# Patient Record
Sex: Male | Born: 1940 | Race: White | Hispanic: No | Smoking: Former smoker
Health system: Southern US, Community
[De-identification: ages and names within clinical notes are randomized; demographics above are authoritative.]

## PROBLEM LIST (undated history)

## (undated) DIAGNOSIS — R251 Tremor, unspecified: Secondary | ICD-10-CM

## (undated) DIAGNOSIS — Z951 Presence of aortocoronary bypass graft: Secondary | ICD-10-CM

## (undated) DIAGNOSIS — K219 Gastro-esophageal reflux disease without esophagitis: Secondary | ICD-10-CM

## (undated) DIAGNOSIS — I10 Essential (primary) hypertension: Secondary | ICD-10-CM

## (undated) DIAGNOSIS — E119 Type 2 diabetes mellitus without complications: Secondary | ICD-10-CM

## (undated) DIAGNOSIS — I519 Heart disease, unspecified: Secondary | ICD-10-CM

## (undated) DIAGNOSIS — G629 Polyneuropathy, unspecified: Secondary | ICD-10-CM

## (undated) DIAGNOSIS — I2510 Atherosclerotic heart disease of native coronary artery without angina pectoris: Secondary | ICD-10-CM

## (undated) DIAGNOSIS — E669 Obesity, unspecified: Secondary | ICD-10-CM

## (undated) DIAGNOSIS — E662 Morbid (severe) obesity with alveolar hypoventilation: Secondary | ICD-10-CM

## (undated) DIAGNOSIS — G4714 Hypersomnia due to medical condition: Secondary | ICD-10-CM

## (undated) DIAGNOSIS — R011 Cardiac murmur, unspecified: Secondary | ICD-10-CM

## (undated) DIAGNOSIS — M179 Osteoarthritis of knee, unspecified: Secondary | ICD-10-CM

## (undated) DIAGNOSIS — M1710 Unilateral primary osteoarthritis, unspecified knee: Secondary | ICD-10-CM

## (undated) DIAGNOSIS — I219 Acute myocardial infarction, unspecified: Secondary | ICD-10-CM

## (undated) HISTORY — DX: Polyneuropathy, unspecified: G62.9

## (undated) HISTORY — PX: OTHER SURGICAL HISTORY: SHX169

## (undated) HISTORY — DX: Heart disease, unspecified: I51.9

## (undated) HISTORY — DX: Atherosclerotic heart disease of native coronary artery without angina pectoris: I25.10

## (undated) HISTORY — DX: Gastro-esophageal reflux disease without esophagitis: K21.9

## (undated) HISTORY — DX: Unilateral primary osteoarthritis, unspecified knee: M17.10

## (undated) HISTORY — PX: HERNIA REPAIR: SHX51

## (undated) HISTORY — PX: TONSILLECTOMY: SUR1361

## (undated) HISTORY — DX: Hypersomnia due to medical condition: G47.14

## (undated) HISTORY — DX: Obesity, unspecified: E66.9

## (undated) HISTORY — DX: Morbid (severe) obesity with alveolar hypoventilation: E66.2

## (undated) HISTORY — DX: Type 2 diabetes mellitus without complications: E11.9

## (undated) HISTORY — DX: Essential (primary) hypertension: I10

## (undated) HISTORY — DX: Osteoarthritis of knee, unspecified: M17.9

## (undated) HISTORY — DX: Presence of aortocoronary bypass graft: Z95.1

---

## 1990-05-19 DIAGNOSIS — Z951 Presence of aortocoronary bypass graft: Secondary | ICD-10-CM

## 1990-05-19 HISTORY — DX: Presence of aortocoronary bypass graft: Z95.1

## 2007-07-01 ENCOUNTER — Inpatient Hospital Stay (HOSPITAL_COMMUNITY): Admission: AD | Admit: 2007-07-01 | Discharge: 2007-07-03 | Payer: Self-pay | Admitting: Cardiology

## 2010-01-02 ENCOUNTER — Encounter (INDEPENDENT_AMBULATORY_CARE_PROVIDER_SITE_OTHER): Payer: Self-pay | Admitting: *Deleted

## 2010-01-07 ENCOUNTER — Encounter: Payer: Self-pay | Admitting: Internal Medicine

## 2010-06-18 ENCOUNTER — Inpatient Hospital Stay (HOSPITAL_COMMUNITY)
Admission: EM | Admit: 2010-06-18 | Discharge: 2010-06-28 | DRG: 234 | Disposition: A | Discharge status: Skilled Nursing Facility | Payer: Medicare Other | Attending: Cardiovascular Disease | Admitting: Cardiovascular Disease

## 2010-06-18 DIAGNOSIS — E119 Type 2 diabetes mellitus without complications: Secondary | ICD-10-CM | POA: Diagnosis present

## 2010-06-18 DIAGNOSIS — I519 Heart disease, unspecified: Secondary | ICD-10-CM | POA: Diagnosis not present

## 2010-06-18 DIAGNOSIS — Z7982 Long term (current) use of aspirin: Secondary | ICD-10-CM

## 2010-06-18 DIAGNOSIS — I10 Essential (primary) hypertension: Secondary | ICD-10-CM | POA: Diagnosis present

## 2010-06-18 DIAGNOSIS — Z7902 Long term (current) use of antithrombotics/antiplatelets: Secondary | ICD-10-CM

## 2010-06-18 DIAGNOSIS — Z951 Presence of aortocoronary bypass graft: Secondary | ICD-10-CM

## 2010-06-18 DIAGNOSIS — I2510 Atherosclerotic heart disease of native coronary artery without angina pectoris: Principal | ICD-10-CM | POA: Diagnosis present

## 2010-06-18 DIAGNOSIS — I509 Heart failure, unspecified: Secondary | ICD-10-CM | POA: Diagnosis not present

## 2010-06-18 DIAGNOSIS — D62 Acute posthemorrhagic anemia: Secondary | ICD-10-CM | POA: Diagnosis not present

## 2010-06-18 DIAGNOSIS — Z01811 Encounter for preprocedural respiratory examination: Secondary | ICD-10-CM

## 2010-06-18 NOTE — Letter (Signed)
Summary: Previsit letter  [facility]  [address] [place], [place] [address]   Phone: [phone]  Fax: [phone]       01/02/2010 MRN: [id number]  Blake Hurst [address] [place], [place]  [address]  Dear [name],  Welcome to the Gastroenterology Division at [facility].    You are scheduled to see a nurse for your pre-procedure visit on 02-13-2010 at 3:30pm  on the 3rd floor at [facility], [address].  We ask that you try to arrive at our office 15 minutes prior to your appointment time to allow for check-in.  Your nurse visit will consist of discussing your medical and surgical history, your immediate family medical history, and your medications.    Please bring a complete list of all your medications or, if you prefer, bring the medication bottles and we will list them.  We will need to be aware of both prescribed and over the counter drugs.  We will need to know exact dosage information as well.  If you are on blood thinners (Coumadin, Plavix, Aggrenox, Ticlid, etc.) please call our office today/prior to your appointment, as we need to consult with your physician about holding your medication.   Please be prepared to read and sign documents such as consent forms, a financial agreement, and acknowledgement forms.  If necessary, and with your consent, a friend or relative is welcome to sit-in on the nurse visit with you.  Please bring your insurance card so that we may make a copy of it.  If your insurance requires a referral to see a specialist, please bring your referral form from your primary care physician.  No co-pay is required for this nurse visit.     If you cannot keep your appointment, please call [phone] to cancel or reschedule prior to your appointment date.  This allows [place] the opportunity to schedule an appointment for another patient in need of care.    Thank you for choosing [facility] for your medical  needs.  We appreciate the opportunity to care for you.  Please visit [place] at our website  to learn more about our practice.                     Sincerely.                                                                                                                   The Gastroenterology Division

## 2010-06-18 NOTE — Letter (Signed)
Summary: New Patient letter  [facility]  [address] [place], [place] [address]   Phone: [phone]  Fax: [phone]       01/07/2010 MRN: [id number]  Blake Hurst [address] [place], [place]  [address]  Dear Mr. HARDAWAY,  Welcome to the Gastroenterology Division at [facility].    You are scheduled to see Dr.  Marina Goodell on 02-27-10 at 1:45pm on the 3rd floor at [facility], [address].  We ask that you try to arrive at our office 15 minutes prior to your appointment time to allow for check-in.  We would like you to complete the enclosed self-administered evaluation form prior to your visit and bring it with you on the day of your appointment.  We will review it with you.  Also, please bring a complete list of all your medications or, if you prefer, bring the medication bottles and we will list them.  Please bring your insurance card so that we may make a copy of it.  If your insurance requires a referral to see a specialist, please bring your referral form from your primary care physician.  Co-payments are due at the time of your visit and may be paid by cash, check or credit card.     Your office visit will consist of a consult with your physician (includes a physical exam), any laboratory testing he/she may order, scheduling of any necessary diagnostic testing (e.g. x-ray, ultrasound, CT-scan), and scheduling of a procedure (e.g. Endoscopy, Colonoscopy) if required.  Please allow enough time on your schedule to allow for any/all of these possibilities.    If you cannot keep your appointment, please call [phone] to cancel or reschedule prior to your appointment date.  This allows [place] the opportunity to schedule an appointment for another patient in need of care.  If you do not cancel or reschedule by 5 p.m. the business day prior to your appointment date, you will be charged a $50.00 late cancellation/no-show fee.    Thank you for choosing  [facility] for your medical needs.  We appreciate the opportunity to care for you.  Please visit [place] at our website  to learn more about our practice.                     Sincerely,                                                             The Gastroenterology Division

## 2010-06-19 ENCOUNTER — Inpatient Hospital Stay (HOSPITAL_COMMUNITY): Payer: Medicare Other

## 2010-06-19 DIAGNOSIS — I25810 Atherosclerosis of coronary artery bypass graft(s) without angina pectoris: Secondary | ICD-10-CM

## 2010-06-19 LAB — GLUCOSE, CAPILLARY
Glucose-Capillary: 152 mg/dL — ABNORMAL HIGH (ref 70–99)
Glucose-Capillary: 199 mg/dL — ABNORMAL HIGH (ref 70–99)
Glucose-Capillary: 235 mg/dL — ABNORMAL HIGH (ref 70–99)

## 2010-06-19 LAB — DIFFERENTIAL
Basophils Absolute: 0.1 10*3/uL (ref 0.0–0.1)
Basophils Relative: 1 % (ref 0–1)
Eosinophils Absolute: 0.4 10*3/uL (ref 0.0–0.7)
Monocytes Absolute: 0.7 10*3/uL (ref 0.1–1.0)
Monocytes Relative: 11 % (ref 3–12)
Neutro Abs: 3.2 10*3/uL (ref 1.7–7.7)
Neutrophils Relative %: 51 % (ref 43–77)

## 2010-06-19 LAB — HEPATIC FUNCTION PANEL
Albumin: 3.6 g/dL (ref 3.5–5.2)
Alkaline Phosphatase: 61 U/L (ref 39–117)
Indirect Bilirubin: 0.4 mg/dL (ref 0.3–0.9)
Total Bilirubin: 0.6 mg/dL (ref 0.3–1.2)
Total Protein: 5.9 g/dL — ABNORMAL LOW (ref 6.0–8.3)

## 2010-06-19 LAB — BRAIN NATRIURETIC PEPTIDE: Pro B Natriuretic peptide (BNP): 395 pg/mL — ABNORMAL HIGH (ref 0.0–100.0)

## 2010-06-19 LAB — TSH: TSH: 2.862 u[IU]/mL (ref 0.350–4.500)

## 2010-06-19 LAB — CBC
MCH: 30 pg (ref 26.0–34.0)
MCHC: 32.3 g/dL (ref 30.0–36.0)
Platelets: 236 10*3/uL (ref 150–400)

## 2010-06-19 LAB — CARDIAC PANEL(CRET KIN+CKTOT+MB+TROPI)
Relative Index: INVALID (ref 0.0–2.5)
Troponin I: 0.07 ng/mL — ABNORMAL HIGH (ref 0.00–0.06)

## 2010-06-19 LAB — HEMOGLOBIN A1C
Hgb A1c MFr Bld: 7.1 % — ABNORMAL HIGH (ref <5.7)
Mean Plasma Glucose: 157 mg/dL — ABNORMAL HIGH (ref <117)

## 2010-06-19 LAB — MAGNESIUM: Magnesium: 1.6 mg/dL (ref 1.5–2.5)

## 2010-06-20 DIAGNOSIS — Z01810 Encounter for preprocedural cardiovascular examination: Secondary | ICD-10-CM

## 2010-06-20 DIAGNOSIS — I2510 Atherosclerotic heart disease of native coronary artery without angina pectoris: Secondary | ICD-10-CM

## 2010-06-20 LAB — GLUCOSE, CAPILLARY
Glucose-Capillary: 148 mg/dL — ABNORMAL HIGH (ref 70–99)
Glucose-Capillary: 165 mg/dL — ABNORMAL HIGH (ref 70–99)
Glucose-Capillary: 170 mg/dL — ABNORMAL HIGH (ref 70–99)

## 2010-06-20 LAB — BASIC METABOLIC PANEL
Chloride: 99 mEq/L (ref 96–112)
GFR calc Af Amer: >60 mL/min (ref >60)
Potassium: 4.4 mEq/L (ref 3.5–5.1)
Sodium: 139 mEq/L (ref 135–145)

## 2010-06-20 LAB — CBC
HCT: 41.7 % (ref 39.0–52.0)
Hemoglobin: 14.1 g/dL (ref 13.0–17.0)
MCV: 92.7 fL (ref 78.0–100.0)
RBC: 4.5 MIL/uL (ref 4.22–5.81)
WBC: 8.5 10*3/uL (ref 4.0–10.5)

## 2010-06-20 LAB — HEPARIN LEVEL (UNFRACTIONATED)
Heparin Unfractionated: 0.91 IU/mL — ABNORMAL HIGH (ref 0.30–0.70)
Heparin Unfractionated: 0.5 IU/mL (ref 0.30–0.70)

## 2010-06-21 ENCOUNTER — Inpatient Hospital Stay (HOSPITAL_COMMUNITY): Payer: Medicare Other

## 2010-06-21 LAB — GLUCOSE, CAPILLARY
Glucose-Capillary: 124 mg/dL — ABNORMAL HIGH (ref 70–99)
Glucose-Capillary: 138 mg/dL — ABNORMAL HIGH (ref 70–99)
Glucose-Capillary: 172 mg/dL — ABNORMAL HIGH (ref 70–99)
Glucose-Capillary: 176 mg/dL — ABNORMAL HIGH (ref 70–99)
Glucose-Capillary: 185 mg/dL — ABNORMAL HIGH (ref 70–99)

## 2010-06-21 LAB — BLOOD GAS, ARTERIAL
Acid-Base Excess: 2.6 mmol/L — ABNORMAL HIGH (ref 0.0–2.0)
Bicarbonate: 26.6 meq/L — ABNORMAL HIGH (ref 20.0–24.0)
Drawn by: 234041
FIO2: 21 %
O2 Saturation: 92.5 %
Patient temperature: 98.6
TCO2: 27.9 mmol/L (ref 0–100)
pCO2 arterial: 40.8 mmHg (ref 35.0–45.0)
pH, Arterial: 7.43 (ref 7.350–7.450)
pO2, Arterial: 63.6 mmHg — ABNORMAL LOW (ref 80.0–100.0)

## 2010-06-21 LAB — CBC
HCT: 41.6 % (ref 39.0–52.0)
Hemoglobin: 14.1 g/dL (ref 13.0–17.0)
MCH: 31.2 pg (ref 26.0–34.0)
MCHC: 33.9 g/dL (ref 30.0–36.0)
MCV: 92 fL (ref 78.0–100.0)
Platelets: 225 K/uL (ref 150–400)
RBC: 4.52 MIL/uL (ref 4.22–5.81)
RDW: 12.8 % (ref 11.5–15.5)
WBC: 7.9 K/uL (ref 4.0–10.5)

## 2010-06-21 LAB — HEPARIN LEVEL (UNFRACTIONATED)

## 2010-06-22 LAB — BASIC METABOLIC PANEL
BUN: 14 mg/dL (ref 6–23)
CO2: 25 mEq/L (ref 19–32)
Chloride: 101 mEq/L (ref 96–112)
Glucose, Bld: 172 mg/dL — ABNORMAL HIGH (ref 70–99)
Potassium: 4 mEq/L (ref 3.5–5.1)
Sodium: 138 mEq/L (ref 135–145)

## 2010-06-22 LAB — CBC
HCT: 40.8 % (ref 39.0–52.0)
MCH: 31.3 pg (ref 26.0–34.0)
MCV: 90.5 fL (ref 78.0–100.0)
Platelets: 251 10*3/uL (ref 150–400)
RBC: 4.51 MIL/uL (ref 4.22–5.81)
WBC: 7.4 10*3/uL (ref 4.0–10.5)

## 2010-06-22 LAB — GLUCOSE, CAPILLARY
Glucose-Capillary: 149 mg/dL — ABNORMAL HIGH (ref 70–99)
Glucose-Capillary: 155 mg/dL — ABNORMAL HIGH (ref 70–99)
Glucose-Capillary: 136 mg/dL — ABNORMAL HIGH (ref 70–99)

## 2010-06-23 LAB — COMPREHENSIVE METABOLIC PANEL
Albumin: 3.9 g/dL (ref 3.5–5.2)
BUN: 15 mg/dL (ref 6–23)
Calcium: 9.8 mg/dL (ref 8.4–10.5)
Chloride: 101 mEq/L (ref 96–112)
Creatinine, Ser: 1.12 mg/dL (ref 0.4–1.5)
GFR calc non Af Amer: >60 mL/min (ref >60)
Total Bilirubin: 0.9 mg/dL (ref 0.3–1.2)

## 2010-06-23 LAB — CBC
MCH: 31 pg (ref 26.0–34.0)
Platelets: 250 10*3/uL (ref 150–400)
RBC: 4.64 MIL/uL (ref 4.22–5.81)
WBC: 8.6 10*3/uL (ref 4.0–10.5)

## 2010-06-23 LAB — APTT: aPTT: 68 seconds — ABNORMAL HIGH (ref 24–37)

## 2010-06-23 LAB — GLUCOSE, CAPILLARY
Glucose-Capillary: 125 mg/dL — ABNORMAL HIGH (ref 70–99)
Glucose-Capillary: 138 mg/dL — ABNORMAL HIGH (ref 70–99)

## 2010-06-23 LAB — PROTIME-INR
INR: 1.03 (ref 0.00–1.49)
Prothrombin Time: 13.7 seconds (ref 11.6–15.2)

## 2010-06-23 LAB — HEPARIN LEVEL (UNFRACTIONATED): Heparin Unfractionated: 0.29 IU/mL — ABNORMAL LOW (ref 0.30–0.70)

## 2010-06-24 ENCOUNTER — Inpatient Hospital Stay (HOSPITAL_COMMUNITY): Payer: Medicare Other

## 2010-06-24 DIAGNOSIS — I25810 Atherosclerosis of coronary artery bypass graft(s) without angina pectoris: Secondary | ICD-10-CM

## 2010-06-24 LAB — POCT I-STAT 4, (NA,K, GLUC, HGB,HCT)
Glucose, Bld: 146 mg/dL — ABNORMAL HIGH (ref 70–99)
Glucose, Bld: 131 mg/dL — ABNORMAL HIGH (ref 70–99)
Glucose, Bld: 114 mg/dL — ABNORMAL HIGH (ref 70–99)
HCT: 37 % — ABNORMAL LOW (ref 39.0–52.0)
HCT: 37 % — ABNORMAL LOW (ref 39.0–52.0)
HCT: 32 % — ABNORMAL LOW (ref 39.0–52.0)
HCT: 27 % — ABNORMAL LOW (ref 39.0–52.0)
HCT: 33 % — ABNORMAL LOW (ref 39.0–52.0)
HCT: 37 % — ABNORMAL LOW (ref 39.0–52.0)
Hemoglobin: 12.6 g/dL — ABNORMAL LOW (ref 13.0–17.0)
Hemoglobin: 11.2 g/dL — ABNORMAL LOW (ref 13.0–17.0)
Hemoglobin: 9.2 g/dL — ABNORMAL LOW (ref 13.0–17.0)
Potassium: 4.9 mEq/L (ref 3.5–5.1)
Potassium: 3.9 mEq/L (ref 3.5–5.1)
Sodium: 138 mEq/L (ref 135–145)
Sodium: 137 mEq/L (ref 135–145)
Sodium: 133 mEq/L — ABNORMAL LOW (ref 135–145)
Sodium: 137 mEq/L (ref 135–145)
Sodium: 139 mEq/L (ref 135–145)

## 2010-06-24 LAB — PROTIME-INR: INR: 1.49 (ref 0.00–1.49)

## 2010-06-24 LAB — BASIC METABOLIC PANEL
CO2: 26 mEq/L (ref 19–32)
Chloride: 105 mEq/L (ref 96–112)
GFR calc Af Amer: >60 mL/min (ref >60)
Potassium: 4.4 mEq/L (ref 3.5–5.1)
Sodium: 138 mEq/L (ref 135–145)

## 2010-06-24 LAB — PLATELET COUNT: Platelets: 149 10*3/uL — ABNORMAL LOW (ref 150–400)

## 2010-06-24 LAB — CBC
Hemoglobin: 14.2 g/dL (ref 13.0–17.0)
Hemoglobin: 12.7 g/dL — ABNORMAL LOW (ref 13.0–17.0)
MCH: 31.5 pg (ref 26.0–34.0)
MCH: 31.8 pg (ref 26.0–34.0)
MCHC: 34.4 g/dL (ref 30.0–36.0)
MCHC: 34.4 g/dL (ref 30.0–36.0)
MCV: 91.6 fL (ref 78.0–100.0)
MCV: 92.5 fL (ref 78.0–100.0)
RBC: 4.52 MIL/uL (ref 4.22–5.81)
RBC: 4.06 MIL/uL — ABNORMAL LOW (ref 4.22–5.81)
RDW: 12.7 % (ref 11.5–15.5)

## 2010-06-24 LAB — POCT I-STAT 3, ART BLOOD GAS (G3+)
Acid-base deficit: 3 mmol/L — ABNORMAL HIGH (ref 0.0–2.0)
Bicarbonate: 23 mEq/L (ref 20.0–24.0)
O2 Saturation: 100 %
O2 Saturation: 95 %
Patient temperature: 38.5
TCO2: 20 mmol/L (ref 0–100)
pCO2 arterial: 46 mmHg — ABNORMAL HIGH (ref 35.0–45.0)
pCO2 arterial: 41.8 mmHg (ref 35.0–45.0)
pCO2 arterial: 37.5 mmHg (ref 35.0–45.0)
pO2, Arterial: 256 mmHg — ABNORMAL HIGH (ref 80.0–100.0)
pO2, Arterial: 63 mmHg — ABNORMAL LOW (ref 80.0–100.0)

## 2010-06-24 LAB — APTT: aPTT: 27 seconds (ref 24–37)

## 2010-06-24 LAB — POCT I-STAT, CHEM 8
Creatinine, Ser: 1.1 mg/dL (ref 0.4–1.5)
Glucose, Bld: 141 mg/dL — ABNORMAL HIGH (ref 70–99)
Hemoglobin: 13.3 g/dL (ref 13.0–17.0)
Potassium: 5.1 mEq/L (ref 3.5–5.1)

## 2010-06-24 LAB — GLUCOSE, CAPILLARY
Glucose-Capillary: 164 mg/dL — ABNORMAL HIGH (ref 70–99)
Glucose-Capillary: 103 mg/dL — ABNORMAL HIGH (ref 70–99)

## 2010-06-24 LAB — CREATININE, SERUM: GFR calc Af Amer: >60 mL/min (ref >60)

## 2010-06-24 LAB — MRSA PCR SCREENING: MRSA by PCR: NEGATIVE

## 2010-06-25 ENCOUNTER — Inpatient Hospital Stay (HOSPITAL_COMMUNITY): Payer: Medicare Other

## 2010-06-25 LAB — CBC
HCT: 32.7 % — ABNORMAL LOW (ref 39.0–52.0)
Hemoglobin: 11.2 g/dL — ABNORMAL LOW (ref 13.0–17.0)
Hemoglobin: 11.3 g/dL — ABNORMAL LOW (ref 13.0–17.0)
MCHC: 33.3 g/dL (ref 30.0–36.0)
RDW: 13.1 % (ref 11.5–15.5)
RDW: 13.3 % (ref 11.5–15.5)
WBC: 11.8 10*3/uL — ABNORMAL HIGH (ref 4.0–10.5)

## 2010-06-25 LAB — POCT I-STAT 3, ART BLOOD GAS (G3+)
Bicarbonate: 18.6 mEq/L — ABNORMAL LOW (ref 20.0–24.0)
Bicarbonate: 18.8 mEq/L — ABNORMAL LOW (ref 20.0–24.0)
Bicarbonate: 20.7 mEq/L (ref 20.0–24.0)
Patient temperature: 38.1
TCO2: 20 mmol/L (ref 0–100)
TCO2: 20 mmol/L (ref 0–100)
TCO2: 22 mmol/L (ref 0–100)
pCO2 arterial: 37.3 mmHg (ref 35.0–45.0)
pCO2 arterial: 39.5 mmHg (ref 35.0–45.0)
pCO2 arterial: 39.8 mmHg (ref 35.0–45.0)
pH, Arterial: 7.291 — ABNORMAL LOW (ref 7.350–7.450)
pH, Arterial: 7.312 — ABNORMAL LOW (ref 7.350–7.450)
pH, Arterial: 7.328 — ABNORMAL LOW (ref 7.350–7.450)
pO2, Arterial: 73 mmHg — ABNORMAL LOW (ref 80.0–100.0)
pO2, Arterial: 77 mmHg — ABNORMAL LOW (ref 80.0–100.0)
pO2, Arterial: 82 mmHg (ref 80.0–100.0)

## 2010-06-25 LAB — BASIC METABOLIC PANEL
CO2: 23 mEq/L (ref 19–32)
Calcium: 8.1 mg/dL — ABNORMAL LOW (ref 8.4–10.5)
GFR calc Af Amer: >60 mL/min (ref >60)
GFR calc non Af Amer: >60 mL/min (ref >60)
Glucose, Bld: 169 mg/dL — ABNORMAL HIGH (ref 70–99)
Potassium: 4.1 mEq/L (ref 3.5–5.1)
Sodium: 140 mEq/L (ref 135–145)

## 2010-06-25 LAB — GLUCOSE, CAPILLARY
Glucose-Capillary: 77 mg/dL (ref 70–99)
Glucose-Capillary: 100 mg/dL — ABNORMAL HIGH (ref 70–99)
Glucose-Capillary: 132 mg/dL — ABNORMAL HIGH (ref 70–99)
Glucose-Capillary: 159 mg/dL — ABNORMAL HIGH (ref 70–99)
Glucose-Capillary: 196 mg/dL — ABNORMAL HIGH (ref 70–99)
Glucose-Capillary: 213 mg/dL — ABNORMAL HIGH (ref 70–99)

## 2010-06-25 LAB — CREATININE, SERUM
Creatinine, Ser: 1.32 mg/dL (ref 0.4–1.5)
GFR calc Af Amer: >60 mL/min (ref >60)

## 2010-06-26 ENCOUNTER — Inpatient Hospital Stay (HOSPITAL_COMMUNITY): Payer: Medicare Other

## 2010-06-26 LAB — CBC
HCT: 32.9 % — ABNORMAL LOW (ref 39.0–52.0)
Hemoglobin: 10.9 g/dL — ABNORMAL LOW (ref 13.0–17.0)
MCH: 31.1 pg (ref 26.0–34.0)
MCHC: 33.1 g/dL (ref 30.0–36.0)
MCV: 94 fL (ref 78.0–100.0)

## 2010-06-26 LAB — CROSSMATCH
ABO/RH(D): O POS
Antibody Screen: NEGATIVE
Unit division: 0

## 2010-06-26 LAB — BASIC METABOLIC PANEL
CO2: 27 mEq/L (ref 19–32)
Calcium: 8.6 mg/dL (ref 8.4–10.5)
Creatinine, Ser: 1.08 mg/dL (ref 0.4–1.5)
Glucose, Bld: 156 mg/dL — ABNORMAL HIGH (ref 70–99)

## 2010-06-26 LAB — GLUCOSE, CAPILLARY: Glucose-Capillary: 140 mg/dL — ABNORMAL HIGH (ref 70–99)

## 2010-06-27 LAB — CBC
MCH: 31.7 pg (ref 26.0–34.0)
Platelets: 184 10*3/uL (ref 150–400)
RBC: 3.56 MIL/uL — ABNORMAL LOW (ref 4.22–5.81)
RDW: 13.3 % (ref 11.5–15.5)
WBC: 12.3 10*3/uL — ABNORMAL HIGH (ref 4.0–10.5)

## 2010-06-27 LAB — GLUCOSE, CAPILLARY
Glucose-Capillary: 144 mg/dL — ABNORMAL HIGH (ref 70–99)
Glucose-Capillary: 134 mg/dL — ABNORMAL HIGH (ref 70–99)
Glucose-Capillary: 139 mg/dL — ABNORMAL HIGH (ref 70–99)

## 2010-06-27 LAB — BASIC METABOLIC PANEL
Chloride: 101 mEq/L (ref 96–112)
Creatinine, Ser: 0.98 mg/dL (ref 0.4–1.5)
GFR calc Af Amer: >60 mL/min (ref >60)
GFR calc non Af Amer: >60 mL/min (ref >60)
Potassium: 4.3 mEq/L (ref 3.5–5.1)

## 2010-06-27 NOTE — Procedures (Signed)
NAME:  Blake Hurst, BATALLA NO.:  [id number]  MEDICAL RECORD NO.:  [id number]           PATIENT TYPE:  I  LOCATION:  2001                         FACILITY:  [facility]  PHYSICIAN:  Nicki Guadalajara, M.D.     DATE OF BIRTH:  11/11/1940  DATE OF PROCEDURE: DATE OF DISCHARGE:                           CARDIAC CATHETERIZATION   INDICATIONS:  Blake Hurst is a 70 year old gentleman who has a history of hypertension, type 2 diabetes mellitus, hyperlipidemia, known coronary artery disease.  He underwent CABG revascularization surgery approximately 15 years ago in [place].  At that time, apparently he had a graft to his circumflex system, and a vein graft to his RCA.  He apparently underwent cardiac catheterization in 2009 by Dr. Jacinto Halim and had stenting at several sites and the sequential graft supplying the circumflex marginal vessels.  At that time, he had an occluded graft which had supplied the RCA and he had good collateralization from the distal circumflex via the graft supplying the distal RCA.  The patient had been doing fairly well.  Apparently, he was admitted to [facility] with chest pains suggestive of possible unstable angina yesterday.  He is now referred for diagnostic cardiac catheterization.  PROCEDURE:  After premedication with Versed 1 mg plus fentanyl 25 mcg, the patient prepped and draped in usual fashion.  His right femoral artery was punctured anteriorly and a 5-French sheath was inserted without difficulty.  Diagnostic catheterization was done utilizing 5- [place] Judkins for left and right coronary catheters.  The right catheter was used for selective angiography into the vein grafts as well as into the unbypassed left internal mammary artery system.  A 5-French pigtail catheter was used for biplane cine left ventriculography as well as distal aortography.  Hemostasis was obtained by direct manual pressure.  The patient  tolerated the procedure well.  HEMODYNAMIC DATA:  Central aortic pressure was 100/53.  Left ventricular pressure 100/15.  ANGIOGRAPHIC DATA:  Left main coronary artery was a short normal vessel, which bifurcated into the LAD and left circumflex system.  The LAD was a moderate-sized vessel that gave rise to a large first diagonal vessel.  There was 80-90% proximal stenosis in the first diagonal vessel as well as an 80% bifurcation stenosis in its midportion.  The LAD in its midsegment had new progressive 80% stenosis and then had another area of diffuse 60% narrowing.  The LAD extended to and wrapped around the apex.  There was significant collateralization to obtuse marginal branch of the circumflex coronary artery via the apical LAD collaterals.  The circumflex vessel had 95-99% ostial stenosis and that was totally occluded after an occluded marginal vessel.  The native right coronary artery was diffusely diseased and had 90% proximal stenosis followed by 80% stenosis and had 95% stenosis in the marginal branch with diffuse 70-80% stenosis.  There was faint filling of the distal LAD with competitive filling possibly due to collaterals originating from the left system.  The vein graft which had supplied the right coronary artery was totally occluded at its origin.  Next, the vein graft which was a sequential graft  supplying several marginal vessels had 50% eccentric narrowing proximally.  The previously placed stent in the distal portion of the proximal limb of the graft was widely patent.  The graft anastomosed into the OM vessel.  The sequential limb was off stented from the prior intervention and this was now subtotally concluded with 99% stenosis. There was very faint filling with TIMI one-half flow in the distal marginal vessel beyond the subtotal occlusion.  The unbypassed left internal mammary artery was patent.  Biplane cine left ventriculography revealed mild LV  dysfunction, ejection fraction was approximately 45%.  There was moderate area of mid to basal posterior hypocontractility on the RAO projection and inferoapical hypocontractility on the LAO projection.  Distal aortography revealed patent renal arteries without significant aortoiliac disease.  IMPRESSION: 1. Mild-to-moderate left ventricular dysfunction with an ejection     fraction of approximately 45% with moderate area of     hypocontractility in the mid to basal posterior wall and     inferoapical segment. 2. Significant three-vessel coronary artery disease with 80-90%     proximal diagonal stenosis followed by 80% mid first diagonal     stenosis of the left anterior descending, 80% stenosis of the mid     left anterior descending followed by 60% stenosis of the left     anterior descending with significant collateralization to an obtuse     marginal branch of the circumflex and faint collateralization to     the distal right coronary artery. 3. Total occlusion of the proximal circumflex coronary artery. 4. High-grade segmental native right coronary artery disease with 90%     proximal stenosis, 80% proximal to mid stenosis, 95% stenosis, and     a moderate-sized marginal branch, diffuse 70-80% stenosis proximal     to the crux and faint filling of the distal right coronary artery. 5. Total occlusion of the vein graft, which had supplied the right     coronary artery (old). 6. Progressive disease in the vein graft which had sequentially     supplied several circumflex marginal vessels, now with 50%     narrowing eccentrically in the proximal portion of the graft, a     patent previously placed stent in the distal limb of the proximal     segment of the graft, and subtotal occlusion and diffusely stented     sequential limb of the graft which had supplied the distal     circumflex marginal vessel with very faint filling of this distal     marginal vessel. 7. No evidence for  renal artery stenosis or significant aortoiliac     disease.  RECOMMENDATIONS:  Blake Hurst is 15 years status post CABG revascularization surgery to the circumflex and right coronary artery. He now has developed progressive disease in his LAD, diagonal system, and now has subtotal occlusion of the graft supplying sequentially the circumflex vessel as well as old occlusion of the graft supplying the right coronary artery.  Collaterals are in jeopardy.  Surgical consultation will be obtained for consideration of possible redo CABG revascularization surgery.          ______________________________ Nicki Guadalajara, M.D.     TK/MEDQ  D:  06/19/2010  T:  06/20/2010  Job:  [id number]  cc:   Ritta Slot, MD  Electronically Signed by Nicki Guadalajara M.D. on 06/27/2010 04:17:31 PM

## 2010-06-27 NOTE — Op Note (Signed)
NAME:  Blake Hurst, Blake Hurst NO.:  [id number]  MEDICAL RECORD NO.:  [id number]           PATIENT TYPE:  I  LOCATION:  2312                         FACILITY:  [facility]  PHYSICIAN:  Salvatore Decent. 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  PROCEDURE:  Redo median sternotomy, extracorporeal circulation, redo coronary artery bypass grafting x3 (left internal mammary artery to LAD, left radial artery to obtuse marginal 2, saphenous vein graft to first diagonal).  SURGEON:  Salvatore Decent. [name], MD  ASSISTANT:  Evelene Croon, MD  SECOND ASSISTANT:  Doree Fudge, PA  THIRD ASSISTANT:  Sol Blazing, PA  ANESTHESIA:  General.  FINDINGS:  Severe intrapericardial adhesions, cardiomegaly, good-quality targets that were grafted, OM-4 and posterior descending both small vessels, 1 mm in diameter without appropriate conduit for grafting, good- quality conduits.  CLINICAL NOTE:  Blake Hurst is a 70 year old gentleman with a history of coronary artery disease.  He had coronary artery bypass grafting in the mid 90s in [place].  He now presents with unstable coronary syndrome.  He had had multiple previous angioplasties and stentings of his vein grafts.  Catheterization on this admission, the patient was found to have severe native vein graft of three-vessel coronary artery disease not amenable to percutaneous intervention.  Ejection fraction was approximately 40%.  The patient was advised to undergo a redo coronary artery bypass grafting.  Indications, risks, benefits, and alternatives  were discussed in detail with the patient.  He understood and accepted the risks and agreed to proceed.  We did discuss possible utilization of left radial artery in addition to the left mammary artery.  The left radial was normal by the vascular Doppler of the palmar arch.  The Allen test was normal in the preoperative holding area and was confirmed with pulse oximetry plethysmography.  Blake Hurst understood and accepted the risks of surgery and agreed to proceed.  OPERATIVE NOTE:  Blake Hurst was brought to the preoperative holding area on June 24, 2010.  There, the Anesthesia Service under the direction of Dr. Jairo Ben placed a Swan-Ganz catheter and an arterial blood pressure monitoring line.  Intravenous antibiotics were administered.  The patient was taken to the operating room, anesthetized, and intubated.  A Foley catheter was placed.  The chest, abdomen, left arm, and legs were prepped and draped in the usual sterile fashion.  An incision was made over the volar aspect of the left wrist overlying the radial pulse.  A short incision was made initially.  Skin and subcutaneous tissue were dissected.  The radial artery was identified and the overlying fascia incised.  A short segment of the radial artery was mobilized.  There was a good pulse distally with proximal compression.  The incision then was extended to just below the antecubital fossa and the vessel was harvested using the harmonic  scalpel.  Simultaneously to this, an incision was made in the medial aspect of the right leg at the level of the knee and the greater saphenous vein was harvested from the right thigh and upper calf endoscopically.  Majority of the thigh vein was unusable, but there was a portion sufficient for grafting the diagonal that was of satisfactory quality.  Simultaneously, again with this, a median sternotomy was performed.  The wires were removed.  The sternum was divided using an  oscillating saw. Shaft of the hemi-sternum was elevated and the underlying tissue was dissected off entering the pleural space on both sides.  A rule track retractor was placed for exposure of the left mammary artery which was harvested in standard fashion.  This was extremely difficult due to the patient's body habitus and scarring from the previous procedure.  The mammary was a good-quality vessel.  The patient was fully heparinized prior to dividing the distal end of the mammary artery.  By this time, radial incision was closed and the vein had been harvested as well and the leg incision was closed.  An Ankeney retractor was placed.  Dissection was carried out.  The pericardium had not been closed.  The pericardial edge was identified on the right side and the right atrium was dissected out.  There was an old occluded vein graft to the posterior descending.  Care was taken not to touch or otherwise disturb the vein graft.  It was left adherent to the heart.  After dissecting out the right atrium, the dissection was carried up to the aorta which was exposed taking care not to manipulate the preexisting vein grafts.  After confirming adequate anticoagulation with ACT measurement, the aorta was cannulated via concentric 2-0 Ethibond pledgeted pursestring sutures.  A dual-stage venous cannula was placed via pursestring suture in the rectal appendage.  Cardiopulmonary bypass was instituted and the patient was cooled to 32 degrees Celsius. The remainder of the dissection was carried out with the heart empty, freeing up the remaining intrapericardial adhesions again using a no- touch technique on the vein graft to obtuse marginals 2 and 4.  The coronary arteries were inspected and anastomotic sites were chosen.  The conduits were inspected and cut to length.  A foam pad was placed in the pericardium to insulate the heart and protect the left phrenic nerve.  A temperature probe was placed in  the myocardial septum and a cardioplegic cannula was placed in the ascending aorta.  The aorta was crossclamped.  The left ventricle was emptied via the aortic root vent.  Cardiac arrest then was achieved with a combination of cold antegrade and retrograde blood cardioplegia and topical iced saline.  Initial 500 mL of cardioplegia was administered antegrade. There was rapid diastolic arrest.  An additional 500 mL was administered retrograde and there was myocardial septal cooling to 8 degrees Celsius. The following distal anastomoses were performed.  First, a reverse saphenous vein graft was placed end-to-side to the first diagonal branch of the LAD.  This was a 1.5-mm vessel at the site of the anastomosis.  There was plaquing proximally, it was a good vessel distally.  The vein graft was anastomosed end-to-side with a running 7-0 Prolene suture.  Vein was relatively thick walled, was normal caliber and was satisfactory, although only fair in quality.  At the completion of the anastomosis, it was probed proximally and distally with a one and a half probe.  All anastomoses were probed proximally and distally at their completion.  Cardioplegia was administered down the vein graft. There was good flow and good hemostasis.Next, the distal end of the left radial artery was beveled and was anastomosed end-to-side to obtuse marginal 2.  This was the dominant lateral branch.  It accepted a 1.5-mm probe both proximally and distally.  It was a good-quality vessel at the site of the anastomosis which was just beyond the previous side-to-side anastomosis.  This was performed with a running 8-0 Prolene suture.  Again, a probe passed easily proximally and distally.  The graft flushed easily.  Cardioplegia was administered.  There was good flow and good hemostasis.  Next, the left internal mammary artery was beveled and pericardium was incised.  The LAD was exposed and an arteriotomy was made.  This  was a 1.5-mm good-quality target vessel.  The left mammary was a 2-mm good- quality conduit, it was anastomosed end-to-side with a running 8-0 Prolene suture.  At the completion of the anastomosis, the bulldog clamp was briefly removed to inspect for hemostasis.  Immediate rapid septal rewarming was noted.  The bulldog clamp was replaced and the mammary pedicle was tacked at the epicardial surface of the heart with 6-0 Prolene sutures.  Additional cardioplegia was administered down the vein graft and aortic root as well as retrograde.  The vein graft and radial artery were cut to length.  The proximal vein graft anastomosis was performed to a 4.0-mm punch aortotomy with a running 6-0 Prolene suture. The radial was placed to the hood of the previous obtuse marginal vein graft which was normal.  A 4.0-mm punch was used to create an opening and the end-to-side anastomosis was performed with a running 7-0 Prolene suture.  At the completion of the final proximal anastomosis, the patient was placed in Trendelenburg position, lidocaine was administered, a warm dose of retrograde cardioplegia was administered to complete de-airing.  The aortic crossclamp was then removed.  The bulldog clamp was removed from the left mammary artery.  The total crossclamp time was 72 minutes.  The patient required defibrillation, but failed to respond to 10 J, but did convert to sinus rhythm after shock with 20 J.  It was then in sinus rhythm thereafter.  While rewarming was completed, all proximal and distal anastomoses were inspected for hemostasis.  Epicardial pacing wires were placed on the right ventricle and right atrium.  When the patient had rewarmed to a core temperature of 37 degrees Celsius, he was weaned from cardiopulmonary bypass on the first attempt.  The total bypass time was 132 minutes.  Plan initially had been to start a dopamine infusion, but the patient did not require that and weaned from  bypass without inotropic support.  Initial cardiac index was greater than 2 L per minute per meter squared and he remained hemodynamically stable throughout the postbypass period.  Test dose protamine was administered and was well tolerated.  The atrial and aortic cannulae were removed.  The remainder of the protamine was administered without incident.  Chest was irrigated with 1 L of warm saline.  Hemostasis was achieved.  There was no attempt to close the pericardium.  A left pleural, right pleural, and single mediastinal chest tubes were placed through separate subcostal incisions.  The sternum was closed with interrupted heavy-gauge double stainless steel wires.  The pectoralis fascia, subcutaneous tissue, and skin were closed in standard fashion.  All sponge, needle, and instrument counts were correct at the end of the procedure.  There were no intraoperative complications, and the patient  was taken from the operating room to the surgical intensive care unit in fair condition.     Salvatore Decent [name], M.D.     SCH/MEDQ  D:  06/24/2010  T:  06/25/2010  Job:  [id number]  cc:   Ritta Slot, MD Gaspar Garbe, M.D.  Electronically Signed by Charlett Lango M.D. on 06/27/2010 10:24:13 AM

## 2010-06-27 NOTE — Consult Note (Signed)
NAME:  EISEN, ROBENSON NO.:  [id number]  MEDICAL RECORD NO.:  [id number]           PATIENT TYPE:  I  LOCATION:  2001                         FACILITY:  [facility]  PHYSICIAN:  Salvatore Decent. [name], M.D.DATE OF BIRTH:  1941/04/20  DATE OF CONSULTATION:  06/19/2010 DATE OF DISCHARGE:                                CONSULTATION   REASON FOR CONSULTATION:  Question of redo bypass grafting.  HISTORY OF PRESENT ILLNESS:  Mr. Duffey is a 70 year old gentleman with a history of coronary artery disease who previously had coronary artery bypass grafting 15 years ago in the area of [place], at that time, a saphenous vein graft apparently was placed to the right coronary artery, then a sequential saphenous vein graft was done to obtuse marginal 1 and 4.  In 2005, the patient had PTCA and stenting of the saphenous vein graft to the obtuse marginals, the saphenous vein to the right coronary artery was occluded.  He underwent additional PTCA and stenting of that graft in 2009.  He says that over the past 3 months, he has been having a tightness in his chest.  He describes this as a sensation similar to breathing extremely cold air, nut was not related to that per se.  He gets it with exertion, sometimes even minimal exertion and is localized over the left breast.  He yesterday had an episode of chest pain, which he described as a severe pressure, which came across his chest and the upper portion of both arms fell extremely heavy.  He came to the emergency room and was treated and has been pain- free since admission.  He did have diaphoresis and shortness of breath associated with the pain.  He did not have any syncope or presyncope. He ruled out for MI by cardiac enzymes.  His EKG showed a right bundle- branch block, previous inferior infarct.  There was T-wave abnormality suggesting lateral ischemia.  Today, he underwent cardiac catheterization by Dr. Nicki Guadalajara, where  he was found to have severe native three-vessel coronary artery disease.  His right coronary artery is essentially subtotaled and receives collaterals.  His LAD had tandem 80 and 60% stenoses, diagonal branch had 80% stenosis.  There were severe disease in the vein graft to obtuse marginals 1 and 4, and essentially subtotaled after obtuse marginal 1.  Ejection fraction was 40% with inferior-basal and anterior-apical hypokinesis.  The patient currently is pain-free.  PAST MEDICAL HISTORY:  Significant for: 1. Coronary artery disease, previous coronary artery bypass grafting     x3 using saphenous vein from left leg, previous PTCA and stenting     in 2005 and 2009. 2. Congestive heart failure, diagnosed this admission. 3. Adult-onset type 2 diabetes mellitus, non-insulin dependent. 4. Dyslipidemia. 5. Varicose veins with recent varicose vein procedure approximately 4     months ago. 6. Hypertension. 7. Venous insufficiency. 8. Umbilical hernia repair. 9. Tonsillectomy.  MEDICATIONS ON ADMISSION: 1. Enteric-coated aspirin 81 mg daily. 2. Plavix 75 mg daily. 3. Lisinopril/hydrochlorothiazide 10/12.5 one tablet daily. 4. Omeprazole 20 mg b.i.d. 5. Lipitor 80 mg daily. 6. Lopressor 50 mg b.i.d. 7. Metformin 1000 mg  b.i.d. 8. Januvia 100 mg daily. 9. Lyrica 75 mg daily.  He is allergic to NIACIN, which causes flushing.  FAMILY HISTORY:  Significant for coronary artery disease.  SOCIAL HISTORY:  The patient has remote history of smoking, but quit about 35 years ago.  He occasionally drinks alcohol.  REVIEW OF SYSTEMS:  The patient has been feeling well other than symptoms noted in the history and physical.  All other systems are negative.  PHYSICAL EXAMINATION:  Mr. Cravens is an obese 70 year old white male in no acute distress. VITAL SIGNS:  Afebrile.  His blood pressure is 121/66, pulse 58, respirations are 16, oxygen saturation is 94% on room air. GENERAL:  He is well  developed and well nourished, although overweight. HEENT:  Unremarkable.  He is wearing glasses. NEUROLOGIC:  Alert and oriented x3 with no focal deficits. NECK:  Supple without thyromegaly, adenopathy, or bruits. CARDIAC:  Has regular rate and rhythm.  Normal S1-S2.  No murmur, rubs or gallops.  He is get a well-healed surgical scar in his mid sternum, 2+ radial artery pulses bilaterally with equivocal Allen test on both sides.  He is left handed. ABDOMEN:  Obese, soft, and nontender. LUNGS:  Clear with equal breath sounds bilaterally. EXTREMITIES:  Lower extremities have venous stasis changes bilaterally below the knees.  There is a well-healed surgical scar from left ankle to mid-thigh on the left.  LABORATORY DATA:  Hemoglobin A1c 7.1.  TSH 2.862.  CPK 69, MB 1.7, troponin 0.07.  BNP 395.  White count 6.3, hematocrit 40, platelets are 236.  Sodium 137, potassium 4.0, BUN 14, creatinine 1.24, nonfasting glucose was 222.  His PT is 13.1 with an INR of 0.97.  LFTs within normal limits.  IMPRESSION:  Mr. Hendriks is a 70 year old gentleman with longstanding coronary artery disease, previous coronary artery bypass grafting back in the mid 90s in the area of [place].  He has had some additional percutaneous transluminal coronary angioplasty and stenting of vein grafts since that time.  He now presents with progression of native disease as well as progression of vein graft disease.  Redo coronary artery bypass grafting is indicated for survival benefit and relief of symptoms, death, adequate conduit can be found to enable complete revascularization.  I discussed in detail with the patient the nature and extends the surgery.  He understands that redo surgery often takes longer, it is more difficult and has less certain short and long-term results.  He understands that the risks of surgery include but are not limited to death, stroke, MI, DVT, PE, bleeding, possible need for  transfusions, infections as well as other organ system dysfunction including respiratory, renal, or GI complications.  He has been on Plavix and that needs to be off for at least 5 days prior to redo bypass grafting.  Earliest possible operative time would be Monday, June 24, 2010.  In the meantime, we need to establish of adequate conduit with the vascular lab assessment of the palmar arches particularly in the right hand given he is left-handed.  We will also need records from his previous surgery in the area of [place], which he says that Dr. Lynnea Ferrier at [facility] has, and also need the records from his vein procedure that was done at [facility] within the past couple of months.  We will also order a vein mapping of the right leg as well as the left thigh and see what conduit may be available from that standpoint before making a final decision as to  redo grafting.     Salvatore Decent [name], M.D.     SCH/MEDQ  D:  06/19/2010  T:  06/20/2010  Job:  [id number]  cc:   Ritta Slot, MD Gaspar Garbe, M.D.  Electronically Signed by Charlett Lango M.D. on 06/27/2010 10:24:09 AM

## 2010-06-28 LAB — GLUCOSE, CAPILLARY
Glucose-Capillary: 158 mg/dL — ABNORMAL HIGH (ref 70–99)
Glucose-Capillary: 128 mg/dL — ABNORMAL HIGH (ref 70–99)

## 2010-07-16 ENCOUNTER — Other Ambulatory Visit: Payer: Self-pay | Admitting: Thoracic Surgery (Cardiothoracic Vascular Surgery)

## 2010-07-16 DIAGNOSIS — I2510 Atherosclerotic heart disease of native coronary artery without angina pectoris: Secondary | ICD-10-CM

## 2010-07-17 ENCOUNTER — Encounter (INDEPENDENT_AMBULATORY_CARE_PROVIDER_SITE_OTHER): Payer: Medicare Other | Admitting: Thoracic Surgery (Cardiothoracic Vascular Surgery)

## 2010-07-17 ENCOUNTER — Ambulatory Visit
Admission: RE | Admit: 2010-07-17 | Discharge: 2010-07-17 | Disposition: A | Discharge status: Home / Self Care | Payer: Medicare Other | Source: Ambulatory Visit | Attending: Thoracic Surgery (Cardiothoracic Vascular Surgery) | Admitting: Thoracic Surgery (Cardiothoracic Vascular Surgery)

## 2010-07-17 DIAGNOSIS — I2510 Atherosclerotic heart disease of native coronary artery without angina pectoris: Secondary | ICD-10-CM

## 2010-07-18 NOTE — Assessment & Plan Note (Signed)
OFFICE VISIT  Blake Hurst, Blake Hurst DOB:  January 07, 1941                                        July 17, 2010 CHART #:  [id number]  REASON FOR VISIT:  Followup after recent redo bypass grafting.  HISTORY OF PRESENT ILLNESS:  Blake Hurst is a 70 year old gentleman who underwent redo bypass grafting x3 on June 24, 2010, using the left mammary to his LAD, left radial artery to his obtuse marginal 2, saphenous vein graft to his first diagonal.  Postoperatively, he had some volume overload.  His postoperative course is otherwise unremarkable.  He returns today for scheduled followup appointment.  He noticed that one of the chest tube sites had opened and had some drainage.  He has been putting Betadine on that.  He has also had some drainage from the incision in his right calf.  He has had some swelling in both his legs which is chronic issue with him with a history of chronic venous stasis.  He has had minimal incisional discomfort and is taking a couple of Ultram before he goes to bed, but otherwise has not had to take those during the day.  CURRENT MEDICATIONS: 1. Lasix 40 mg daily. 2. Potassium 20 mEq daily. 3. Imdur 30 mg daily. 4. Enteric-coated aspirin 81 mg daily. 5. Metoprolol 25 mg daily. 6. Januvia 100 mg daily. 7. Lipitor 80 mg nightly. 8. Lyrica 75 mg daily. 9. Metformin 1000 mg b.i.d. 10.Omeprazole 20 mg b.i.d. 11.Mucinex 1200 mg b.i.d. 12.Ultram p.r.n.  PHYSICAL EXAMINATION:  VITAL SIGNS:  Blood pressure 123/75, pulse 70, respirations 16, and oxygen saturation 97% on room air. GENERAL:  He is a 70 year old obese gentleman in no acute distress. CARDIAC:  Regular rate and rhythm.  Normal S1 and S2.  No murmurs, rubs, or gallops.  His sternal incision is clean, dry, and intact.  Sternum is stable. LUNGS:  Clear with equal breath sounds bilaterally.  Chest tube site on the right upper abdomen is open and granulating with no signs  of infection. EXTREMITIES:  His right leg has 3+ edema and the left leg has 2+ edema.  Chest x-ray shows good aeration of the lungs bilaterally.  There is no effusion and infiltrates.  IMPRESSION:  Blake Hurst is a 70 year old gentleman, status post redo bypass grafting.  He is doing at this point in time.  He is walking about two-thirds of a mile a day.  He is having minimal discomfort.  He relates he is only taking pain medication before he goes to bed at night.  He denies any recurrent anginal-type symptoms.  He is still on Imdur for his radial artery graft.  He does look volume overloaded and has peripheral edema.  I advised him to go up to twice daily on his Lasix and to 40 mEq daily on his potassium.  He has an appointment with Dr. Lynnea Ferrier in about a week and he can reassess at that point to see if he needs to stay on the higher dose or if he can go back down to the lower dose.  I will set up an appointment to see him back in about 3 weeks to check on his incisions if he is still having problems with those at that time.  He is not to lift any heavy objects greater than 10 pounds for another 3 weeks.  He may  begin driving on a very limited basis.  Appropriate precautions were discussed with him.  Salvatore Decent [name], M.D. Electronically Signed  SCH/MEDQ  D:  07/17/2010  T:  07/18/2010  Job:  [id number]  cc:   Ritta Slot, MD Gaspar Garbe, M.D.

## 2010-07-23 NOTE — Discharge Summary (Addendum)
NAME:  Blake Hurst, Blake Hurst NO.:  [id number]  MEDICAL RECORD NO.:  [id number]           PATIENT TYPE:  I  LOCATION:  2014                         FACILITY:  [facility]  PHYSICIAN:  Salvatore Decent. [name], M.D.DATE OF BIRTH:  [birth year]  DATE OF ADMISSION:  06/18/2010 DATE OF DISCHARGE:                              DISCHARGE SUMMARY   ADMITTING DIAGNOSES: 1. History of coronary artery disease (status post coronary artery     bypass graft x3 approximately 15 years ago in [place], [place],     percutaneous transluminal coronary angioplasty and stent in 2005     and 2009). 2. History of diabetes mellitus. 3. History of dyslipidemia. 4. History of hypertension. 5. History of varicose veins (status post varicose vein procedure     approximately 4 months ago). 6. History of venous insufficiency. 7. Congestive heart failure (upon this admission).  DISCHARGE DIAGNOSES: 1. History of coronary artery disease (status post coronary artery     bypass graft x3 approximately 15 years ago in [place], [place],     percutaneous transluminal coronary angioplasty and stent in 2005     and 2009). 2. History of diabetes mellitus. 3. History of dyslipidemia. 4. History of hypertension. 5. History of varicose veins (status post varicose vein procedure     approximately 4 months ago). 6. History of venous insufficiency. 7. Congestive heart failure (upon this admission). 8. Acute blood loss anemia.  PROCEDURES: 1. Cardiac catheterization performed by Dr. Tresa Endo on June 19, 2010. 2. Redo sternotomy, extracorporeal circulation, redo coronary artery     bypass grafting surgery x3 (LIMA to LAD, left radial artery to     obtuse marginal II, SVG to diagonal I with open the left radial     artery harvest and EVH from the right thigh and lower extremity by     Dr. [name] on June 24, 2010).  HISTORY OF PRESENTING ILLNESS:  This is a 70 year old Caucasian male with the  aforementioned past medical history who states that over the past 3 months, he had been experiencing tightness in his chest.  He describes this as a sensation similar to breathing extremely cold air and this usually occurs with exertion, (although sometimes very minimal exertion).  The pain then seems to localize over the left breast area. He then had an episode of chest pain on June 18, 2010, which he described as severe which came across his chest and upper portion of both arms and his arms felt "extremely heavy."  In addition, he did have diaphoresis and shortness of breath associated with this episode, but he denied any syncope or presyncope.  He presented to [facility] Emergency Room for further evaluation and treatment.  He did overall out for an MI by cardiac enzymes.  EKG done showed a right bundle-branch block and previous inferior infarct.  There was a T-wave abnormalities suggesting lateral ischemia.  As a result, the patient underwent a cardiac catheterization by Dr. Tresa Endo on June 19, 2010.  Results showed the right coronary artery was essentially occluded; however, he did have collateralization, his LAD had a tandem 80 and 60% stenosis, and diagonal 1 had a  80% stenosis and there was severe disease in the vein graft to the OM 1 and 4, and an essentially a subdural occlusion after OM 1.  His EF was estimated to be 40% with inferior basal and anterior apical hypokinesis.        The patient did remain chest pain free.  A cardiothoracic consultation was obtained with Dr. [name] for the consideration of reduced or not mean coronary artery bypass grafting surgery.  The patient did undergo a 2-D echocardiogram as well on June 19, 2010, which showed an EF of 45-50%, no pericardial effusion, no significant valvular disease, regional wall motion abnormality (hypokinesis of basal midinferior myocardium), features consistent with grade 2 diastolic dysfunction, no  pericardial effusion.  Preoperative carotid duplex carotid ultrasound showed no evidence of significant internal carotid artery stenosis bilaterally.  ABIs were greater than 1 bilaterally.  Potential risks, complications, and benefits of surgery were discussed with the patient.  The patient agreed to proceed, he underwent the redo sternotomy and CABG x3 on June 24, 2010.  BRIEF HOSPITAL COURSE STAY:  The patient was extubated the evening of surgery without difficulty.  He initially did require AAI pacing at 90. He had a T-max 99.9, but later became afebrile.  He was weaned off his nitro and Neo.  S[place], chest tubes, A-line and Foley were all removed early in his postoperative course.  He was found to have acute blood loss anemia.  His H and H went as low as 10.9 and 32.9 respectively.  His last H and H was increased 11.3 and 33.2 respectively.  He did not require postoperative transfusion.  The patient did have mild leukocytosis postoperatively.  His white count went as high as 13,400.  His last white count was down to 12,300.  He did remain afebrile and no signs of wound infection.  No GU complaints. No evidence of pneumonia on chest x-ray (atelectasis present); however, this could possibly be secondary to a systemic inflammatory response to the surgery.  He was found to be volume overloaded and diuresed accordingly.        As previously stated, the patient had a history of diabetes mellitus as he tolerate his diet better.  He was initially started on Januvia, then metformin.  His metformin was increased to his preoperative dose of 1000 mg p.o. two times daily and his insulin was discontinued.  His hemoglobin A1c preoperatively was found to be 7.1. The patient was instructed to follow up with his medical doctor regarding further diabetes management as an outpatient.  The patient had already been started on a low-dose beta-blocker and this was titrated as tolerated.  He was felt  surgically stable for transfer from the Intensive Care into to [facility] for further convalescence on June 25, 2010.  He continued to progress with cardiac rehab.  Physical Therapy and Occupational Therapy consults were obtained as the patient would need to go to an SNF upon discharge.  Currently on postop day #3, the patient has been tolerating a diet.  He is passing flatus, but no bowel movement yet.  He has been given a laxative to assist with this.  PHYSICAL EXAMINATION:  VITAL SIGNS:  He is afebrile, heart rate 70, blood pressure 111/65, O2 sat 95% on room air.  Preop weight is 132 kg, today's weight down to 135.3 kg.  CBGs 144, 141, and 67 respectively. CARDIOVASCULAR:  Regular rate and rhythm. PULMONARY:  Slight decrease at the bases. ABDOMEN:  Soft and nontender.  Bowel sounds  present.  Some distention. EXTREMITIES:  Positive lower extremity edema bilaterally.  All of his wounds are clean and dry.  His motor and sensory is intact in his left upper extremity.  There are no signs of wound infection.  The patient's epicardial pacing wires are going to be removed today. His chest tube sutures were removed prior to his discharge.  Provided he remains afebrile, hemodynamically stable and pending morning round evaluation, he will be surgically stable for discharge to an SNF on June 28, 2010.  Latest laboratory studies are as follows:  BMET done on June 27, 2010; potassium 4.3, sodium 136, BUN and creatinine 17 and 0.98 respectively.  CBC done on this date; H and H 11.3 and 33.2, platelet count of 184,000, white blood cell count 12,300.  Last chest x-ray done on June 26, 2010; showed low lung volumes with bibasilar atelectasis left greater than right, small bilateral pleural effusions left greater than right.  No pneumothorax.  DISCHARGE INSTRUCTIONS:  Diet:  The patient is to remain on low-sodium heart-healthy diabetic diet. Activity:  No driving, no strenuous activity, no  lifting more than 10 pounds.  The patient is to continue with his breathing exercise daily. He is to walk daily and is to increase his frequency and duration as tolerates. Wound Care:  He may shower.  He is to cleanse his wounds with mild soap and water.  He is to call the office if any wound problems arise.  FOLLOWUP APPOINTMENTS: 1. The patient needs to contact Dr. Donavan Burnet office for followup     appointment in 2 weeks. 2. The patient has an appointment to see Dr. [name] on July 17, 2010 at 1:15 p.m., and 30 minutes prior to this office     appointment, a chest x-ray will be obtained.  DISCHARGE MEDICATIONS AT TIME OF DICTATION: 1. Lasix 40 mg p.o. daily. 2. Mucinex 1200 mg p.o. two times daily for cough. 3. Imdur 30 mg p.o. daily. 4. Potassium chloride 20 mEq p.o. daily. 5. Ultram 50 mg one to two tablets every 4-6 hours as needed for pain. 6. Enteric-coated aspirin 325 mg p.o. daily. 7. Metoprolol tartrate 25 mg p.o. daily. 8. Januvia 100 mg p.o. daily. 9. Lipitor 80 mg p.o. at bedtime. 10.Lyrica 75 mg p.o. daily. 11.Metformin 1000 mg p.o. two times daily. 12.Omeprazole 20 mg p.o. two times daily.  Please note the patient has not been restarted on an ACE inhibitor as his blood pressure is well controlled with the beta-blocker, this may not be restarted as an outpatient.     Doree Fudge, PA   ______________________________ Salvatore Decent [name], M.D.    DZ/MEDQ  D:  06/27/2010  T:  06/27/2010  Job:  [id number]  cc:   Ritta Slot, MD Gaspar Garbe, M.D.  Electronically Signed by Doree Fudge PA on 06/28/2010 09:26:19 AM Electronically Signed by Charlett Lango M.D. on 07/23/2010 12:20:21 PM Electronically Signed by Charlett Lango M.D. on 07/23/2010 28:41:32 PM Electronically Signed by Charlett Lango M.D. on 07/23/2010 12:46:39 PM Electronically Signed by Charlett Lango M.D. on 07/23/2010 01:04:38 PM Electronically  Signed by Charlett Lango M.D. on 07/23/2010 01:24:58 PM Electronically Signed by Charlett Lango M.D. on 07/23/2010 01:46:25 PM Electronically Signed by Charlett Lango M.D. on 07/23/2010 02:06:12 PM Electronically Signed by Charlett Lango M.D. on 07/23/2010 02:26:37 PM Electronically Signed by Charlett Lango M.D. on 07/23/2010 02:49:34 PM Electronically Signed by Charlett Lango M.D. on 07/23/2010 03:13:38 PM Electronically Signed by Charlett Lango M.D. on  07/23/2010 03:38:18 PM Electronically Signed by Charlett Lango M.D. on 07/23/2010 04:14:08 PM Electronically Signed by Charlett Lango M.D. on 07/23/2010 04:45:23 PM Electronically Signed by Charlett Lango M.D. on 07/23/2010 05:18:42 PM Electronically Signed by Charlett Lango M.D. on 07/23/2010 05:18:42 PM Electronically Signed by Charlett Lango M.D. on 07/23/2010 07:32:31 PM

## 2010-08-05 ENCOUNTER — Encounter: Payer: Medicare Other | Admitting: Thoracic Surgery (Cardiothoracic Vascular Surgery)

## 2010-08-07 LAB — CBC
HCT: 43.4 % (ref 39.0–52.0)
Hemoglobin: 14.8 g/dL (ref 13.0–17.0)
MCV: 91.9 fL (ref 78.0–100.0)
RBC: 4.72 MIL/uL (ref 4.22–5.81)
WBC: 7.5 10*3/uL (ref 4.0–10.5)

## 2010-08-07 LAB — DIFFERENTIAL
Basophils Absolute: 0.1 10*3/uL (ref 0.0–0.1)
Eosinophils Relative: 7 % — ABNORMAL HIGH (ref 0–5)
Lymphocytes Relative: 24 % (ref 12–46)
Lymphs Abs: 1.8 10*3/uL (ref 0.7–4.0)
Neutro Abs: 4.3 10*3/uL (ref 1.7–7.7)
Neutrophils Relative %: 58 % (ref 43–77)

## 2010-08-07 LAB — BASIC METABOLIC PANEL
CO2: 27 mEq/L (ref 19–32)
Chloride: 100 mEq/L (ref 96–112)
Creatinine, Ser: 1.24 mg/dL (ref 0.4–1.5)
GFR calc Af Amer: >60 mL/min (ref >60)
Potassium: 4 mEq/L (ref 3.5–5.1)
Sodium: 137 mEq/L (ref 135–145)

## 2010-08-07 LAB — CK TOTAL AND CKMB (NOT AT ARMC)
CK, MB: 1.5 ng/mL (ref 0.3–4.0)
Relative Index: INVALID (ref 0.0–2.5)
Total CK: 72 U/L (ref 7–232)

## 2010-08-07 LAB — PROTIME-INR: INR: 0.97 (ref 0.00–1.49)

## 2010-10-01 NOTE — Cardiovascular Report (Signed)
NAME:  Blake Hurst, Blake Hurst NO.:  [id number]   MEDICAL RECORD NO.:  [id number]          PATIENT TYPE:  INP   LOCATION:  2807                         FACILITY:  [facility]   PHYSICIAN:  Cristy Hilts. Jacinto Halim, MD       DATE OF BIRTH:  07/22/1940   DATE OF PROCEDURE:  07/02/2007  DATE OF DISCHARGE:                            CARDIAC CATHETERIZATION   ATTENDING CARDIOLOGIST:  Darlin Priestly, M.D.   PROCEDURE PERFORMED:  1. Left ventriculography.  2. Selective left and right coronary angiography.  3. Saphenous vein graft angiography.  4. PTCA and stenting of the saphenous vein graft to the obtuse      marginal one and OM4 step graft using a Spider filter device      protection.   INDICATIONS:  Blake Hurst is a 66-year gentleman with known  coronary artery disease.  He has undergone prior bypass surgery,  including saphenous vein graft to RCA which was occluded and SVG to OM1  and OM4.  The right coronary artery was collateralized by the circumflex  coronary artery.  The LAD had mild disease and was treated medically.  He had undergone PTCA and stenting of the SVG to OM1 and OM4 in March  2005 with implantation of a 4.5 x 24 mm and a 4.5 x 12 mm bare metal  stent in the body and the distal part of the vein graft in a tandem  fashion.  He also has undergone balloon angioplasty of the distal  circumflex branch, which gave a type 3 collateral to the right coronary  artery with balloon angioplasty results only.  It was heavily tortuous  and was left alone with suboptimal results; however, TIMI flow was  maintained at that time.  He had been doing well until recently.  He was  admitted to the hospital with chest pain suggestive of unstable angina  yesterday with positive troponins.  He is now brought to the cardiac  catheterization lab to reevaluate his coronary anatomy.   HEMODYNAMIC DATA:  The left ventricular pressure was 144/8 with end  diastolic pressure of  20 mmHg.   Aortic pressure was 134/40 with a mean  of 85 mmHg.  There was no significant pressure gradient across the  aortic valve.   ANGIOGRAPHIC DATA:  Right and left ventricular  systolic function was  normal with ejection fraction of 55% to 60%.  There was no significant  mitral regurgitation.  There was no significant wall motion abnormality.   Right coronary artery:  The right coronary artery was a moderate-  caliber vessel with diffuse disease and is occluded in the mid segment  after giving origin to a moderate-to-large size RV branch.  This RV  branch has tandem 90% to 95% stenoses.  The distal RCA is supplied by  collaterals from the circumflex coronary artery and gives origin to  tortuous PDA and PLA branches.   Left main coronary artery:  The left main coronary artery is a large-  caliber vessel that is smooth and normal.   LAD:  The LAD is a large-caliber vessel.  It has mild  diffuse luminal  irregularity.  It gives origin to a moderate-sized diagonal 1 which has  a 80% to 90% focal stenosis and a distal 40% stenosis.  These stenoses  were noted during previous cardiac catheterization.  The LAD in the mid-  to-distal segment had a 30% stenosis.   Circumflex coronary artery:  The circumflex coronary artery is a large-  caliber vessel and is occluded in its proximal segment.  Distally the  circumflex coronary artery gives origin to several obtuse marginals.  These obtuse marginals are supplied by saphenous vein graft.   The distal circumflex coronary artery gives type 3 collaterals to the  right coronary artery.   The obtuse marginal vessels include OM1, small OM2, a very small OM3 and  a large OM4.   SVG to OM1 and OM4:  The SVG to OM1 and OM4 is a large vessel.  There  was a proximal 90% to 95% stenosis.  There were 2 stents noted in the  body and the distal segment of the saphenous vein graft.  These stents  were widely patent.   INTERVENTION:  There was successful PTCA  and direct stenting of the SVG  to OM1 and OM4 with implantation of a 4.5 x 32 mm Liberte stent deployed  at 16 atmospheres of pressure, giving a 5.0-mm lumen.  Post stenting,  there was distal spasm noted, which was relieved with intracoronary  nitroglycerin and intracoronary verapamil.  There was mild 10% to 20%  luminal irregularity distal to the stent.   RECOMMENDATIONS:  The patient will be continued on aggressive risk  modification.  He had a large amount of debris that was retrieved  through the filter protection device.   A total of 220 mL of contrast was utilized for diagnostic angiography.   TECHNIQUE:  I performed the procedure with the usual sterile precautions  using a 6-French right femoral arterial access.  A 6-French multipurpose  B2 catheter was advanced into the ascending aorta and then to the left  ventricle and left ventriculography was performed.  The left  ventriculography was performed in both LAO and RAO projection.  The  catheter was pulled into the ascending aorta and saphenous vein graft to  the obtuse marginal was selectively cannulated and angiography was  performed.  Saphenous vein graft to the right coronary artery was also  selectively cannulated and angiography was performed.  Then the catheter  was utilized to engage the right coronary artery and the left main  coronary artery and angiography was performed.  The catheter was then  pulled out of the body.   TECHNIQUE OF INTERVENTION:  Exchanging the 6-French sheath to a 7-French  sheath, a 7-French left bypass graft with side-hole catheter was  utilized to engage the saphenous vein graft to the obtuse marginals.  Then using an ATW guidewire, the lesion length was carefully measured.  Intracoronary nitroglycerin was also administered.  Then a spider  protection device  was advanced through the saphenous vein graft and  placed distally.  After deploying the filter, the ATW guidewire was  withdrawn.  The  rest of the angioplasty was performed over the spider  filter wire.  I performed a direct stenting with a 4.0 x 32 mm Liberte  stent deployed at 16 atmospheres of pressure, giving a 5-mm lumen.  Post  stenting angiography revealed severe spasm in the distal outflow of the  stent.  Initially I felt that there could have been potential dissection  or flow- limiting lesion.  Hence I gave nitroglycerin, also  intracoronary verapamil.  I had withdrawn all the wires back outside of  the graft and angiography was repeated.  I reintroduced the ATW  guidewire and before we could place the filter wire back into the vein  graft, repeat angiography revealed excellent results with complete  resolution of the spasm.  Hence, we felt that this lesion should be left  alone.  Overall excellent results were noted with reduction of stenosis  from 90% to 95% to 0% with TIMI III flow maintained at the end of the  procedure.  A large amount of debris was retrieved from the protection  device.  The patient tolerated the procedure.  The guidewires were withdrawn.  Guide catheter disengaged and pulled out of the body.  No immediate  complication noted.  During the procedure, intravenous heparin and  Integrilin were utilized for keeping ACT at therapeutic range.      Cristy Hilts. Jacinto Halim, MD  Electronically Signed     JRG/MEDQ  D:  07/02/2007  T:  07/03/2007  Job:  [id number]   cc:   Darlin Priestly, MD  Gaspar Garbe, M.D.

## 2010-10-01 NOTE — Discharge Summary (Signed)
NAME:  Blake Hurst, Blake Hurst NO.:  [id number]   MEDICAL RECORD NO.:  [id number]          PATIENT TYPE:  INP   LOCATION:  6532                         FACILITY:  [facility]   PHYSICIAN:  Ritta Slot, MD     DATE OF BIRTH:  05-23-1940   DATE OF ADMISSION:  07/01/2007  DATE OF DISCHARGE:  07/03/2007                               DISCHARGE SUMMARY   DISCHARGE DIAGNOSES:  1. Unstable angina and crescendo angina.  2. Coronary artery disease with history of bypass grafting in 1996 now      with stenosis in a saphenous vein graft to obtuse marginal-1 and      obtuse marginal-4 of 90% undergoing percutaneous transluminal      coronary angioplasty (PTCA) and stent deployment with a Spider-FX.  3. Left ventricular function is normal at 55 to 60%.  4. Diabetes mellitus type 2.  5. Dyslipidemia.   DISCHARGE CONDITION:  Improved.   PROCEDURES:  1. July 02, 2007:  Right and left heart cath and graft      visualization by Dr. Yates Decamp.  2. July 02, 2007:  PTCA and stent deployment x3 to the saphenous      vein graft to OM1 and OM4 reducing 90% stenosis to zero.  A large      amount of debris in the Spider filter wire.   HISTORY OF PRESENT ILLNESS:  A 70 year old patient of Dr. Mikey Bussing with  known coronary disease including bypass graft in '96, presented to the  office on July 01, 2007, and was seen by Dr. Lynnea Ferrier for  accelerating angina described as burning tightness in his mid chest wall  radiating to left arm associated with diaphoresis, and he also was  beginning to have chest pain at rest associated with shortness of  breath.   The patient was sent to the hospital for an admission, IV heparin and IV  nitroglycerin and plans for cardiac cath, which were done on July 02, 2007, with results as stated previously.   DISCHARGE MEDICATIONS:  1. Aspirin 325 mg daily until he sees Dr. Jenne Campus.  2. Lisinopril/HCTZ 10/12.5 one daily.  3. Plavix 75 mg daily.  4.  Omeprazole 20 mg daily.  5. Lipitor 80 mg daily.  6. Metoprolol 50 mg twice a day.  7. Metformin 1000 mg twice a day that he will not start until Monday,      July 05, 2007.  8. He is not stop his Plavix.   DISCHARGE INSTRUCTIONS:  1. A low-sodium, heart-healthy, diabetic diet.  2. Increase activity slowly; may shower or bathe; no lifting for two      days; no driving for two days.  3. Wash right groin cath site with soap and water; call if any      bleeding, swelling, or drainage.  4. Follow up with Dr. Jenne Campus, July 13, 2007, at 8:30 a.m.   PAST MEDICAL HISTORY:  As stated previously.   HOSPITAL COURSE:  The patient underwent PTA and stent deployment as  stated.  He was placed on 6500 overnight and remained stable.  He will  be discharged  on July 03, 2007.  He will follow up as an outpatient  with Dr. Jenne Campus.  If there are complications, he will need to stay for  another 24 hours.      Darcella Gasman. [place], N.P.      Ritta Slot, MD  Electronically Signed    LRI/MEDQ  D:  07/02/2007  T:  07/04/2007  Job:  [id number]   cc:   Gaspar Garbe, M.D.  Darlin Priestly, MD

## 2011-02-07 LAB — CK TOTAL AND CKMB (NOT AT ARMC)
CK, MB: 2.9
Relative Index: INVALID
Total CK: 78

## 2011-02-07 LAB — CBC
HCT: 41.5
HCT: 45.5
HCT: 41.6
Hemoglobin: 14
MCHC: 33.7
MCHC: 34.4
MCV: 93.2
MCV: 93.4
Platelets: 292
Platelets: 251
RBC: 4.45
RDW: 13.6
WBC: 7.8

## 2011-02-07 LAB — PROTIME-INR
INR: 1.1
Prothrombin Time: 13.9
Prothrombin Time: 14.8

## 2011-02-07 LAB — TROPONIN I: Troponin I: 0.19 — ABNORMAL HIGH

## 2011-02-07 LAB — BASIC METABOLIC PANEL
BUN: 19
BUN: 15
Chloride: 103
Creatinine, Ser: 1.07
GFR calc non Af Amer: >60
GFR calc non Af Amer: >60
Glucose, Bld: 142 — ABNORMAL HIGH
Potassium: 4.4
Potassium: 4.4
Sodium: 139

## 2011-02-07 LAB — COMPREHENSIVE METABOLIC PANEL
ALT: 17
Albumin: 4.1
Alkaline Phosphatase: 70
Calcium: 10
GFR calc Af Amer: >60
Potassium: 4.9
Sodium: 138
Total Protein: 6.7

## 2011-02-07 LAB — HEMOGLOBIN A1C: Hgb A1c MFr Bld: 7 — ABNORMAL HIGH

## 2011-02-07 LAB — MAGNESIUM: Magnesium: 1.7

## 2011-02-07 LAB — TSH: TSH: 1.6

## 2011-02-07 LAB — CARDIAC PANEL(CRET KIN+CKTOT+MB+TROPI)
CK, MB: 1.6
CK, MB: 2.1
Relative Index: INVALID
Total CK: 76
Total CK: 84
Troponin I: 0.07 — ABNORMAL HIGH

## 2011-02-07 LAB — DIFFERENTIAL
Basophils Relative: 1
Eosinophils Absolute: 0.5
Eosinophils Relative: 6 — ABNORMAL HIGH
Lymphs Abs: 2.1
Monocytes Relative: 8
Neutrophils Relative %: 59

## 2011-02-07 LAB — LIPID PANEL: Cholesterol: 105

## 2011-02-07 LAB — B-NATRIURETIC PEPTIDE (CONVERTED LAB): Pro B Natriuretic peptide (BNP): 52

## 2011-06-04 DIAGNOSIS — G589 Mononeuropathy, unspecified: Secondary | ICD-10-CM | POA: Diagnosis not present

## 2011-06-04 DIAGNOSIS — E1129 Type 2 diabetes mellitus with other diabetic kidney complication: Secondary | ICD-10-CM | POA: Diagnosis not present

## 2011-06-04 DIAGNOSIS — I10 Essential (primary) hypertension: Secondary | ICD-10-CM | POA: Diagnosis not present

## 2011-06-04 DIAGNOSIS — I2510 Atherosclerotic heart disease of native coronary artery without angina pectoris: Secondary | ICD-10-CM | POA: Diagnosis not present

## 2011-06-04 DIAGNOSIS — N182 Chronic kidney disease, stage 2 (mild): Secondary | ICD-10-CM | POA: Diagnosis not present

## 2011-07-28 DIAGNOSIS — M169 Osteoarthritis of hip, unspecified: Secondary | ICD-10-CM | POA: Diagnosis not present

## 2011-08-18 DIAGNOSIS — M545 Low back pain: Secondary | ICD-10-CM | POA: Diagnosis not present

## 2011-08-18 DIAGNOSIS — M169 Osteoarthritis of hip, unspecified: Secondary | ICD-10-CM | POA: Diagnosis not present

## 2011-09-16 DIAGNOSIS — E1129 Type 2 diabetes mellitus with other diabetic kidney complication: Secondary | ICD-10-CM | POA: Diagnosis not present

## 2011-09-16 DIAGNOSIS — G589 Mononeuropathy, unspecified: Secondary | ICD-10-CM | POA: Diagnosis not present

## 2011-09-16 DIAGNOSIS — N182 Chronic kidney disease, stage 2 (mild): Secondary | ICD-10-CM | POA: Diagnosis not present

## 2011-09-16 DIAGNOSIS — E785 Hyperlipidemia, unspecified: Secondary | ICD-10-CM | POA: Diagnosis not present

## 2011-09-29 DIAGNOSIS — M545 Low back pain: Secondary | ICD-10-CM | POA: Diagnosis not present

## 2011-09-29 DIAGNOSIS — M169 Osteoarthritis of hip, unspecified: Secondary | ICD-10-CM | POA: Diagnosis not present

## 2011-12-17 DIAGNOSIS — E785 Hyperlipidemia, unspecified: Secondary | ICD-10-CM | POA: Diagnosis not present

## 2011-12-17 DIAGNOSIS — Z125 Encounter for screening for malignant neoplasm of prostate: Secondary | ICD-10-CM | POA: Diagnosis not present

## 2011-12-17 DIAGNOSIS — I10 Essential (primary) hypertension: Secondary | ICD-10-CM | POA: Diagnosis not present

## 2011-12-17 DIAGNOSIS — E1129 Type 2 diabetes mellitus with other diabetic kidney complication: Secondary | ICD-10-CM | POA: Diagnosis not present

## 2011-12-24 DIAGNOSIS — I10 Essential (primary) hypertension: Secondary | ICD-10-CM | POA: Diagnosis not present

## 2011-12-24 DIAGNOSIS — E1129 Type 2 diabetes mellitus with other diabetic kidney complication: Secondary | ICD-10-CM | POA: Diagnosis not present

## 2011-12-24 DIAGNOSIS — N182 Chronic kidney disease, stage 2 (mild): Secondary | ICD-10-CM | POA: Diagnosis not present

## 2011-12-24 DIAGNOSIS — Z0000 Encounter for general adult medical examination without abnormal findings: Secondary | ICD-10-CM | POA: Diagnosis not present

## 2011-12-24 DIAGNOSIS — I2510 Atherosclerotic heart disease of native coronary artery without angina pectoris: Secondary | ICD-10-CM | POA: Diagnosis not present

## 2011-12-25 DIAGNOSIS — Z1212 Encounter for screening for malignant neoplasm of rectum: Secondary | ICD-10-CM | POA: Diagnosis not present

## 2012-02-05 DIAGNOSIS — Z23 Encounter for immunization: Secondary | ICD-10-CM | POA: Diagnosis not present

## 2012-04-27 DIAGNOSIS — E782 Mixed hyperlipidemia: Secondary | ICD-10-CM | POA: Diagnosis not present

## 2012-04-27 DIAGNOSIS — I10 Essential (primary) hypertension: Secondary | ICD-10-CM | POA: Diagnosis not present

## 2012-04-27 DIAGNOSIS — Z951 Presence of aortocoronary bypass graft: Secondary | ICD-10-CM | POA: Diagnosis not present

## 2012-04-27 DIAGNOSIS — E119 Type 2 diabetes mellitus without complications: Secondary | ICD-10-CM | POA: Diagnosis not present

## 2012-05-18 DIAGNOSIS — H52209 Unspecified astigmatism, unspecified eye: Secondary | ICD-10-CM | POA: Diagnosis not present

## 2012-05-18 DIAGNOSIS — H25019 Cortical age-related cataract, unspecified eye: Secondary | ICD-10-CM | POA: Diagnosis not present

## 2012-05-18 DIAGNOSIS — E119 Type 2 diabetes mellitus without complications: Secondary | ICD-10-CM | POA: Diagnosis not present

## 2012-05-28 DIAGNOSIS — H66019 Acute suppurative otitis media with spontaneous rupture of ear drum, unspecified ear: Secondary | ICD-10-CM | POA: Diagnosis not present

## 2012-06-02 DIAGNOSIS — H7290 Unspecified perforation of tympanic membrane, unspecified ear: Secondary | ICD-10-CM | POA: Diagnosis not present

## 2012-06-02 DIAGNOSIS — H6690 Otitis media, unspecified, unspecified ear: Secondary | ICD-10-CM | POA: Diagnosis not present

## 2012-06-28 DIAGNOSIS — I10 Essential (primary) hypertension: Secondary | ICD-10-CM | POA: Diagnosis not present

## 2012-06-28 DIAGNOSIS — H7290 Unspecified perforation of tympanic membrane, unspecified ear: Secondary | ICD-10-CM | POA: Diagnosis not present

## 2012-06-28 DIAGNOSIS — E1129 Type 2 diabetes mellitus with other diabetic kidney complication: Secondary | ICD-10-CM | POA: Diagnosis not present

## 2012-06-28 DIAGNOSIS — E785 Hyperlipidemia, unspecified: Secondary | ICD-10-CM | POA: Diagnosis not present

## 2012-11-30 DIAGNOSIS — Z6838 Body mass index (BMI) 38.0-38.9, adult: Secondary | ICD-10-CM | POA: Diagnosis not present

## 2012-11-30 DIAGNOSIS — N182 Chronic kidney disease, stage 2 (mild): Secondary | ICD-10-CM | POA: Diagnosis not present

## 2012-11-30 DIAGNOSIS — G589 Mononeuropathy, unspecified: Secondary | ICD-10-CM | POA: Diagnosis not present

## 2012-11-30 DIAGNOSIS — G4710 Hypersomnia, unspecified: Secondary | ICD-10-CM | POA: Diagnosis not present

## 2012-11-30 DIAGNOSIS — R350 Frequency of micturition: Secondary | ICD-10-CM | POA: Diagnosis not present

## 2012-12-24 DIAGNOSIS — I10 Essential (primary) hypertension: Secondary | ICD-10-CM | POA: Diagnosis not present

## 2012-12-24 DIAGNOSIS — E785 Hyperlipidemia, unspecified: Secondary | ICD-10-CM | POA: Diagnosis not present

## 2012-12-24 DIAGNOSIS — E1129 Type 2 diabetes mellitus with other diabetic kidney complication: Secondary | ICD-10-CM | POA: Diagnosis not present

## 2012-12-24 DIAGNOSIS — Z125 Encounter for screening for malignant neoplasm of prostate: Secondary | ICD-10-CM | POA: Diagnosis not present

## 2012-12-24 DIAGNOSIS — I2510 Atherosclerotic heart disease of native coronary artery without angina pectoris: Secondary | ICD-10-CM | POA: Diagnosis not present

## 2013-01-03 DIAGNOSIS — E1129 Type 2 diabetes mellitus with other diabetic kidney complication: Secondary | ICD-10-CM | POA: Diagnosis not present

## 2013-01-03 DIAGNOSIS — E785 Hyperlipidemia, unspecified: Secondary | ICD-10-CM | POA: Diagnosis not present

## 2013-01-03 DIAGNOSIS — Z0000 Encounter for general adult medical examination without abnormal findings: Secondary | ICD-10-CM | POA: Diagnosis not present

## 2013-01-03 DIAGNOSIS — I252 Old myocardial infarction: Secondary | ICD-10-CM | POA: Diagnosis not present

## 2013-01-03 DIAGNOSIS — G589 Mononeuropathy, unspecified: Secondary | ICD-10-CM | POA: Diagnosis not present

## 2013-01-03 DIAGNOSIS — I2510 Atherosclerotic heart disease of native coronary artery without angina pectoris: Secondary | ICD-10-CM | POA: Diagnosis not present

## 2013-01-03 DIAGNOSIS — I10 Essential (primary) hypertension: Secondary | ICD-10-CM | POA: Diagnosis not present

## 2013-01-03 DIAGNOSIS — K921 Melena: Secondary | ICD-10-CM | POA: Diagnosis not present

## 2013-01-03 DIAGNOSIS — Z125 Encounter for screening for malignant neoplasm of prostate: Secondary | ICD-10-CM | POA: Diagnosis not present

## 2013-01-05 DIAGNOSIS — Z1212 Encounter for screening for malignant neoplasm of rectum: Secondary | ICD-10-CM | POA: Diagnosis not present

## 2013-01-06 DIAGNOSIS — H60399 Other infective otitis externa, unspecified ear: Secondary | ICD-10-CM | POA: Diagnosis not present

## 2013-01-24 ENCOUNTER — Encounter: Payer: Self-pay | Admitting: Neurology

## 2013-01-24 ENCOUNTER — Ambulatory Visit (INDEPENDENT_AMBULATORY_CARE_PROVIDER_SITE_OTHER): Payer: Medicare Other | Admitting: Neurology

## 2013-01-24 VITALS — BP 109/63 | HR 72 | Resp 17 | Ht 73.0 in | Wt 291.0 lb

## 2013-01-24 DIAGNOSIS — G4714 Hypersomnia due to medical condition: Secondary | ICD-10-CM | POA: Diagnosis not present

## 2013-01-24 DIAGNOSIS — E662 Morbid (severe) obesity with alveolar hypoventilation: Secondary | ICD-10-CM | POA: Diagnosis not present

## 2013-01-24 DIAGNOSIS — G4730 Sleep apnea, unspecified: Secondary | ICD-10-CM | POA: Diagnosis not present

## 2013-01-24 HISTORY — DX: Hypersomnia due to medical condition: G47.14

## 2013-01-24 HISTORY — DX: Morbid (severe) obesity with alveolar hypoventilation: E66.2

## 2013-01-24 NOTE — Patient Instructions (Addendum)
Sleep Apnea  Sleep apnea is a sleep disorder characterized by abnormal pauses in breathing while you sleep. When your breathing pauses, the level of oxygen in your blood decreases. This causes you to move out of deep sleep and into light sleep. As a result, your quality of sleep is poor, and the system that carries your blood throughout your body (cardiovascular system) experiences stress. If sleep apnea remains untreated, the following conditions can develop:  High blood pressure (hypertension).  Coronary artery disease.  Inability to achieve or maintain an erection (impotence).  Impairment of your thought process (cognitive dysfunction). There are three types of sleep apnea: 1. Obstructive sleep apnea Pauses in breathing during sleep because of a blocked airway. 2. Central sleep apnea Pauses in breathing during sleep because the area of the brain that controls your breathing does not send the correct signals to the muscles that control breathing. 3. Mixed sleep apnea A combination of both obstructive and central sleep apnea. RISK FACTORS The following risk factors can increase your risk of developing sleep apnea:  Being overweight.  Smoking.  Having narrow passages in your nose and throat.  Being of older age.  Being male.  Alcohol use.  Sedative and tranquilizer use.  Ethnicity. Among individuals younger than 35 years, African Americans are at increased risk of sleep apnea. SYMPTOMS   Difficulty staying asleep.  Daytime sleepiness and fatigue.  Loss of energy.  Irritability.  Loud, heavy snoring.  Morning headaches.  Trouble concentrating.  Forgetfulness.  Decreased interest in sex. DIAGNOSIS  In order to diagnose sleep apnea, your caregiver will perform a physical examination. Your caregiver may suggest that you take a home sleep test. Your caregiver may also recommend that you spend the night in a sleep lab. In the sleep lab, several monitors record  information about your heart, lungs, and brain while you sleep. Your leg and arm movements and blood oxygen level are also recorded. TREATMENT The following actions may help to resolve mild sleep apnea:  Sleeping on your side.   Using a decongestant if you have nasal congestion.   Avoiding the use of depressants, including alcohol, sedatives, and narcotics.   Losing weight and modifying your diet if you are overweight. There also are devices and treatments to help open your airway:  Oral appliances. These are custom-made mouthpieces that shift your lower jaw forward and slightly open your bite. This opens your airway.  Devices that create positive airway pressure. This positive pressure "splints" your airway open to help you breathe better during sleep. The following devices create positive airway pressure:  Continuous positive airway pressure (CPAP) device. The CPAP device creates a continuous level of air pressure with an air pump. The air is delivered to your airway through a mask while you sleep. This continuous pressure keeps your airway open.  Nasal expiratory positive airway pressure (EPAP) device. The EPAP device creates positive air pressure as you exhale. The device consists of single-use valves, which are inserted into each nostril and held in place by adhesive. The valves create very little resistance when you inhale but create much more resistance when you exhale. That increased resistance creates the positive airway pressure. This positive pressure while you exhale keeps your airway open, making it easier to breath when you inhale again.  Bilevel positive airway pressure (BPAP) device. The BPAP device is used mainly in patients with central sleep apnea. This device is similar to the CPAP device because it also uses an air pump to deliver  continuous air pressure through a mask. However, with the BPAP machine, the pressure is set at two different levels. The pressure when you  exhale is lower than the pressure when you inhale.  Surgery. Typically, surgery is only done if you cannot comply with less invasive treatments or if the less invasive treatments do not improve your condition. Surgery involves removing excess tissue in your airway to create a wider passage way. Document Released: 04/25/2002 Document Revised: 11/04/2011 Document Reviewed: 09/11/2011 [facility] Patient Information 2014 [place], [place]. Polysomnography (Sleep Studies) Polysomnography (PSG) is a series of tests used for detecting (diagnosing) obstructive sleep apnea and other sleep disorders. The tests measure how some parts of your body are working while you are sleeping. The tests are extensive and expensive. They are done in a sleep lab or hospital, and vary from center to center. Your caregiver may perform other more simple sleep studies and questionnaires before doing more complete and involved testing. Testing may not be covered by insurance. Some of these tests are:  An EEG (Electroencephalogram). This tests your brain waves and stages of sleep.  An EOG (Electrooculogram). This measures the movements of your eyes. It detects periods of REM (rapid eye movement) sleep, which is your dream sleep.  An EKG (Electrocardiogram). This measures your heart rhythm.  EMG (Electromyography). This is a measurement of how the muscles are working in your upper airway and your legs while sleeping.  An oximetry measurement. It measures how much oxygen (air) you are getting while sleeping.  Breathing efforts may be measured. The same test can be interpreted (understood) differently by different caregivers and centers that study sleep.  Studies may be given an apnea/hypopnea index (AHI). This is a number which is found by counting the times of no breathing or under breathing during the night, and relating those numbers to the amount of time spent in bed. When the AHI is greater than 15, the patient is likely to  complain of daytime sleepiness. When the AHI is greater than 30, the patient is at increased risk for heart problems and must be followed more closely. Following the AHI also allows you to know how treatment is working. Simple oximetry (tracking the amount of oxygen that is taken in) can be used for screening patients who:  Do not have symptoms (problems) of OSA.  Have a normal Epworth Sleepiness Scale Score.  Have a low pre-test probability of having OSA.  Have none of the upper airway problems likely to cause apnea.  Oximetry is also used to determine if treatment is effective in patients who showed significant desaturations (not getting enough oxygen) on their home sleep study. One extra measure of safety is to perform additional studies for the person who only snores. This is because no one can predict with absolute certainty who will have OSA. Those who show significant desaturations (not getting enough oxygen) are recommended to have a more detailed sleep study. Document Released: 11/09/2002 Document Revised: 07/28/2011 Document Reviewed: 05/05/2005 [facility] Patient Information 2014 [place], [place]. Exercise to Lose Weight Exercise and a healthy diet may help you lose weight. Your doctor may suggest specific exercises. EXERCISE IDEAS AND TIPS  Choose low-cost things you enjoy doing, such as walking, bicycling, or exercising to workout videos.  Take stairs instead of the elevator.  Walk during your lunch break.  Park your car further away from work or school.  Go to a gym or an exercise class.  Start with 5 to 10 minutes of exercise each day.  Build up to 30 minutes of exercise 4 to 6 days a week.  Wear shoes with good support and comfortable clothes.  Stretch before and after working out.  Work out until you breathe harder and your heart beats faster.  Drink extra water when you exercise.  Do not do so much that you hurt yourself, feel dizzy, or get very short of  breath. Exercises that burn about 150 calories:  Running 1  miles in 15 minutes.  Playing volleyball for 45 to 60 minutes.  Washing and waxing a car for 45 to 60 minutes.  Playing touch football for 45 minutes.  Walking 1  miles in 35 minutes.  Pushing a stroller 1  miles in 30 minutes.  Playing basketball for 30 minutes.  Raking leaves for 30 minutes.  Bicycling 5 miles in 30 minutes.  Walking 2 miles in 30 minutes.  Dancing for 30 minutes.  Shoveling snow for 15 minutes.  Swimming laps for 20 minutes.  Walking up stairs for 15 minutes.  Bicycling 4 miles in 15 minutes.  Gardening for 30 to 45 minutes.  Jumping rope for 15 minutes.  Washing windows or floors for 45 to 60 minutes. Document Released: 06/07/2010 Document Revised: 07/28/2011 Document Reviewed: 06/07/2010 [facility] Patient Information 2014 [place], [place].

## 2013-01-24 NOTE — Progress Notes (Signed)
[facility]  Provider:  Melvyn Novas, M D  Referring Provider: Gaspar Garbe, MD Primary Care Physician:  Gaspar Garbe, MD  Chief Complaint  Patient presents with  . New Evaluation    rm 11,[place],falls asleep very easily, Hx of heart surgery, rule out OSA,paper referral    HPI:  Blake Hurst  is a 72 y.o. male  Is seen here as a referral/ revisit  from Dr. Wylene Simmer for a sleep consultation.  Blake Hurst, a Caucasian 72 year old right-handed gentleman presents today for evaluation of sleepiness. When he recently saw his primary care physician and he had endorsed the Epworth sleepiness score at 17 points and reported chronic pain issue is affecting the quality of his sleep. The patient and was at bedtime at about midnight , he wakes up around 6 and 7 AM, he has nocturia about 3 times at night but wears depends as he has not regained full chondral of his bladder function on diuretics. Estimates nocturnal sleep at 6 hours, and sleeps with 2 cats, lives alone. Has achy legs and moves his toes constantly, but endorsed  restless legs as creepy crawly sensation.   He takes almost daily naps, not scheduled , but due  uncontrolled drowsiness . He reported that some days he may be ready to fall asleep again already during the morning hours, his most likely time to doors off his 1 or 2 PM after lunch when he normally sits in her recliner. Pulse naps can take several hours. He feels more restored and refreshed of vessels naps than he feels in the morning. He sometimes calls to her son's house to watch sporting events or mass car races and he states that he always falls asleep when doing so. Recently he has noted morning headaches upon awakening, he sometimes has a dry mouth as well. He lives alone and there is no altered report on his sleep habits while asleep such as snoring, apneas or restless legs. He is a former smoker, and gained 35 pounds the year after quitting, 15  years ago, has over 45 pack years. May have COPD .   The patient endorsed the Epworth sleepiness score here today at 18 points and the fatigue severity score of 44 points the geriatric depression score is and was at 4 points.   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 No fever, nausea, vision changes and no epistaxis. He is chronically hoarse.      He had especially difficulties to sleep related to  flank pain and stated that it was hard for him in the morning to get up and walk as the pain was as its most severe as at that time of day.  He also has a history of obesity, coronary artery disease, had open-heart surgery in 1992 for the 3 vessels stents placed 3 three-vessel treatment in 2004 and began one-vessel stent basement in 2009. 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  Review of Systems: Out of a complete 14 system review, the patient complains of only the following symptoms, and all other reviewed systems are negative.  Epworth 18, FSS 42 and GDS 4 .  Toes  feel numb as if not there, he has  sometimes blurred vision , he has extreme fatigue palpitations,  swelling in his legs, a creepy crawly sensations in his legs, ringing in his ear .  History   Social History  . Marital Status: Single    Spouse Name: N/A    Number of Children: 4  . Years of Education: 12   Occupational History  . retired     Education officer, community   Social History Main Topics  . Smoking status: Former Games developer  . Smokeless tobacco: Never Used     Comment: quit 35-40 years ago  . Alcohol Use: Yes     Comment: occas. drink  . Drug Use: No  . Sexual Activity: Not on file   Other Topics Concern  . Not on file    Social History Narrative  . No narrative on file    Family History  Problem Relation Age of Onset  . Diabetes Sister   . Hypertension Sister   . Diabetes Brother   . Cancer - Prostate Brother   . Heart attack Brother   . Cancer - Prostate Father     Past Medical History  Diagnosis Date  . GERD (gastroesophageal reflux disease)   . CAD (coronary artery disease)   . S/P CABG x 3 1992  . Hypertension   . Neuropathy   . OA (osteoarthritis) of knee   . Obesity (BMI 30-39.9)   . Diabetes mellitus without complication   . Heart disease     Past Surgical History  Procedure Laterality Date  . Open heart surgeries    . Ruptured belly button    . Tonsillectomy      Current Outpatient Prescriptions  Medication Sig Dispense Refill  . aspirin 81 MG tablet Take 81 mg by mouth daily.      Marland Kitchen atorvastatin (LIPITOR) 80 MG tablet One tablet daily      . clopidogrel (PLAVIX) 75 MG tablet daily.       . clotrimazole-betamethasone (LOTRISONE) cream Apply topically 2 (two) times daily. Apply to legs      . furosemide (LASIX) 40 MG tablet 40 mg. One tablet Mon,Wed., Friday      . Insulin Detemir (LEVEMIR [place]) Inject into the skin. 30 units daily      . lisinopril-hydrochlorothiazide (PRINZIDE,ZESTORETIC) 20-12.5 MG per tablet daily.       . metFORMIN (GLUCOPHAGE) 1000 MG tablet 1,000 mg 2 (two) times daily.       . metoprolol (LOPRESSOR) 50 MG tablet 2 (two) times daily.       Marland Kitchen NOVOTWIST 30G X 8 MM MISC One needle daily      . omeprazole (PRILOSEC) 20 MG capsule daily.       . pregabalin (LYRICA) 75 MG capsule Take 75 mg by mouth 2 (two) times daily.      Marland Kitchen VICTOZA 18 MG/3ML SOPN One needle daily       No current facility-administered medications for this visit.    Allergies as of 01/24/2013 - Review Complete 01/24/2013  Allergen Reaction Noted  . Niacin and related  01/24/2013    Vitals: BP 109/63  Pulse 72  Resp 17  Ht 6\' 1"  (1.854 m)  Wt 291 lb (131.997 kg)  BMI 38.4  kg/m2 Last Weight:  Wt Readings from Last 1 Encounters:  01/24/13 291 lb (131.997 kg)   Last Height:   Ht Readings from Last 1 Encounters:  01/24/13 6\' 1"  (1.854 m)    Physical exam:  General: The patient is awake, alert  and appears not in acute distress. The patient is well groomed. Head: Normocephalic, atraumatic. Neck is supple. Mallampati 4 , neck circumference: 18 inches ,  No nasal septal deviation , no TMJ .  Cardiovascular:  Regular rate and rhythm,  without  murmurs or carotid bruit, and without distended neck veins. Respiratory: Lungs are clear to auscultation. Skin:  Without evidence of edema, or rash Trunk: BMI is elevated. Abdomen is obese.   Neurologic exam : The patient is awake and alert, oriented to place and time.  Memory subjective  described as intact. There is a normal attention span & concentration ability.  Speech is fluent without dysarthria,  but dysphonia  Mood and affect are appropriate.  Cranial nerves: Pupils are equal and briskly reactive to light. Funduscopic exam without  evidence of pallor or edema. Extraocular movements  in vertical and horizontal planes intact and without nystagmus. Visual fields by finger perimetry are intact. Hearing to finger rub intact.  Facial sensation intact to fine touch. Facial motor strength is symmetric and tongue and uvula move midline.  Motor exam:   Normal tone and normal muscle bulk .  Sensory:  Fine touch, pinprick and vibration were tested in all extremities and he seems to have  Right carpal tunnel and peripheral neuropathy on both feet.   Coordination: Rapid alternating movements in the fingers/hands  without evidence of ataxia, dysmetria or tremor.  Gait and station: Patient walks without assistive device but needs to brace himself to rise from a seated position.  Deep tendon reflexes: in the  upper and lower extremities are symmetric and intact. Babinski  downgoing.   Assessment:  After physical and  neurologic examination, review of laboratory studies, imaging, neurophysiology testing and pre-existing records, assessment is  That of a patient lately was in all prolapse syndrome of obstructive sleep apnea and obesity hypoventilation. I suspect that Blake Hurst will have low oxygen levels at night and that if CPAP or BiPAP to correct these he may need to start on oxygen. His primary insurance is Medicare which will allow a status that this study at an AHI of 5 given his comorbidities of several heart surgeries, coronary artery disease, abdominal hernia, diabetes mellitus, leg edema, neuropathy.    Plan:  Treatment plan and additional workup : Split at 5 and score at 4% . Give 02 if needed.

## 2013-01-26 DIAGNOSIS — K625 Hemorrhage of anus and rectum: Secondary | ICD-10-CM | POA: Diagnosis not present

## 2013-01-27 DIAGNOSIS — I10 Essential (primary) hypertension: Secondary | ICD-10-CM | POA: Diagnosis not present

## 2013-01-27 DIAGNOSIS — Z6838 Body mass index (BMI) 38.0-38.9, adult: Secondary | ICD-10-CM | POA: Diagnosis not present

## 2013-01-27 DIAGNOSIS — I2510 Atherosclerotic heart disease of native coronary artery without angina pectoris: Secondary | ICD-10-CM | POA: Diagnosis not present

## 2013-01-27 DIAGNOSIS — E1129 Type 2 diabetes mellitus with other diabetic kidney complication: Secondary | ICD-10-CM | POA: Diagnosis not present

## 2013-01-27 DIAGNOSIS — E669 Obesity, unspecified: Secondary | ICD-10-CM | POA: Diagnosis not present

## 2013-02-03 ENCOUNTER — Telehealth: Payer: Self-pay | Admitting: *Deleted

## 2013-02-03 NOTE — Telephone Encounter (Signed)
Faxed clearance to hold clopidogrel 5 days prior to procedure and restart as soon a practical, per Dr. Rennis Golden, to [facility]

## 2013-02-07 DIAGNOSIS — Z23 Encounter for immunization: Secondary | ICD-10-CM | POA: Diagnosis not present

## 2013-02-15 ENCOUNTER — Ambulatory Visit (INDEPENDENT_AMBULATORY_CARE_PROVIDER_SITE_OTHER): Payer: Medicare Other | Admitting: Neurology

## 2013-02-15 DIAGNOSIS — G4730 Sleep apnea, unspecified: Secondary | ICD-10-CM | POA: Diagnosis not present

## 2013-02-15 DIAGNOSIS — G4700 Insomnia, unspecified: Secondary | ICD-10-CM | POA: Diagnosis not present

## 2013-02-25 ENCOUNTER — Telehealth: Payer: Self-pay | Admitting: Neurology

## 2013-02-25 ENCOUNTER — Encounter: Payer: Self-pay | Admitting: *Deleted

## 2013-02-25 NOTE — Telephone Encounter (Signed)
I called and left a message for the patient that his sleep study results are in and to call me for those results. I also informed the patient that a copy will be mailed to him and faxed to Dr. Deneen Harts office.

## 2013-03-23 DIAGNOSIS — R195 Other fecal abnormalities: Secondary | ICD-10-CM | POA: Diagnosis not present

## 2013-03-23 DIAGNOSIS — D126 Benign neoplasm of colon, unspecified: Secondary | ICD-10-CM | POA: Diagnosis not present

## 2013-03-23 DIAGNOSIS — K620 Anal polyp: Secondary | ICD-10-CM | POA: Diagnosis not present

## 2013-03-24 ENCOUNTER — Other Ambulatory Visit: Payer: Self-pay

## 2013-04-25 ENCOUNTER — Telehealth: Payer: Self-pay | Admitting: *Deleted

## 2013-04-25 NOTE — Telephone Encounter (Signed)
Error

## 2013-05-18 DIAGNOSIS — H2510 Age-related nuclear cataract, unspecified eye: Secondary | ICD-10-CM | POA: Diagnosis not present

## 2013-05-18 DIAGNOSIS — H02409 Unspecified ptosis of unspecified eyelid: Secondary | ICD-10-CM | POA: Diagnosis not present

## 2013-05-18 DIAGNOSIS — E119 Type 2 diabetes mellitus without complications: Secondary | ICD-10-CM | POA: Diagnosis not present

## 2013-05-18 DIAGNOSIS — H524 Presbyopia: Secondary | ICD-10-CM | POA: Diagnosis not present

## 2013-08-12 DIAGNOSIS — Z1331 Encounter for screening for depression: Secondary | ICD-10-CM | POA: Diagnosis not present

## 2013-08-12 DIAGNOSIS — M545 Low back pain, unspecified: Secondary | ICD-10-CM | POA: Diagnosis not present

## 2013-08-12 DIAGNOSIS — I10 Essential (primary) hypertension: Secondary | ICD-10-CM | POA: Diagnosis not present

## 2013-08-12 DIAGNOSIS — N182 Chronic kidney disease, stage 2 (mild): Secondary | ICD-10-CM | POA: Diagnosis not present

## 2013-08-12 DIAGNOSIS — E785 Hyperlipidemia, unspecified: Secondary | ICD-10-CM | POA: Diagnosis not present

## 2013-08-12 DIAGNOSIS — I252 Old myocardial infarction: Secondary | ICD-10-CM | POA: Diagnosis not present

## 2013-08-12 DIAGNOSIS — E669 Obesity, unspecified: Secondary | ICD-10-CM | POA: Diagnosis not present

## 2013-08-12 DIAGNOSIS — E1129 Type 2 diabetes mellitus with other diabetic kidney complication: Secondary | ICD-10-CM | POA: Diagnosis not present

## 2013-08-12 DIAGNOSIS — G4733 Obstructive sleep apnea (adult) (pediatric): Secondary | ICD-10-CM | POA: Diagnosis not present

## 2013-11-18 DIAGNOSIS — H9209 Otalgia, unspecified ear: Secondary | ICD-10-CM | POA: Diagnosis not present

## 2014-01-11 DIAGNOSIS — Z125 Encounter for screening for malignant neoplasm of prostate: Secondary | ICD-10-CM | POA: Diagnosis not present

## 2014-01-11 DIAGNOSIS — E1129 Type 2 diabetes mellitus with other diabetic kidney complication: Secondary | ICD-10-CM | POA: Diagnosis not present

## 2014-01-11 DIAGNOSIS — I2510 Atherosclerotic heart disease of native coronary artery without angina pectoris: Secondary | ICD-10-CM | POA: Diagnosis not present

## 2014-01-18 DIAGNOSIS — G589 Mononeuropathy, unspecified: Secondary | ICD-10-CM | POA: Diagnosis not present

## 2014-01-18 DIAGNOSIS — E1129 Type 2 diabetes mellitus with other diabetic kidney complication: Secondary | ICD-10-CM | POA: Diagnosis not present

## 2014-01-18 DIAGNOSIS — K219 Gastro-esophageal reflux disease without esophagitis: Secondary | ICD-10-CM | POA: Diagnosis not present

## 2014-01-18 DIAGNOSIS — I10 Essential (primary) hypertension: Secondary | ICD-10-CM | POA: Diagnosis not present

## 2014-01-18 DIAGNOSIS — N183 Chronic kidney disease, stage 3 unspecified: Secondary | ICD-10-CM | POA: Diagnosis not present

## 2014-01-18 DIAGNOSIS — I2510 Atherosclerotic heart disease of native coronary artery without angina pectoris: Secondary | ICD-10-CM | POA: Diagnosis not present

## 2014-01-18 DIAGNOSIS — E785 Hyperlipidemia, unspecified: Secondary | ICD-10-CM | POA: Diagnosis not present

## 2014-01-18 DIAGNOSIS — Z0000 Encounter for general adult medical examination without abnormal findings: Secondary | ICD-10-CM | POA: Diagnosis not present

## 2014-01-26 DIAGNOSIS — Z1212 Encounter for screening for malignant neoplasm of rectum: Secondary | ICD-10-CM | POA: Diagnosis not present

## 2014-01-27 DIAGNOSIS — Z23 Encounter for immunization: Secondary | ICD-10-CM | POA: Diagnosis not present

## 2014-03-01 DIAGNOSIS — Z1212 Encounter for screening for malignant neoplasm of rectum: Secondary | ICD-10-CM | POA: Diagnosis not present

## 2014-05-23 DIAGNOSIS — E119 Type 2 diabetes mellitus without complications: Secondary | ICD-10-CM | POA: Diagnosis not present

## 2014-05-23 DIAGNOSIS — H2511 Age-related nuclear cataract, right eye: Secondary | ICD-10-CM | POA: Diagnosis not present

## 2014-05-23 DIAGNOSIS — H26492 Other secondary cataract, left eye: Secondary | ICD-10-CM | POA: Diagnosis not present

## 2014-05-23 DIAGNOSIS — H5201 Hypermetropia, right eye: Secondary | ICD-10-CM | POA: Diagnosis not present

## 2014-06-15 DIAGNOSIS — H26492 Other secondary cataract, left eye: Secondary | ICD-10-CM | POA: Diagnosis not present

## 2014-06-15 DIAGNOSIS — H2640 Unspecified secondary cataract: Secondary | ICD-10-CM | POA: Diagnosis not present

## 2014-08-09 DIAGNOSIS — I10 Essential (primary) hypertension: Secondary | ICD-10-CM | POA: Diagnosis not present

## 2014-08-09 DIAGNOSIS — K219 Gastro-esophageal reflux disease without esophagitis: Secondary | ICD-10-CM | POA: Diagnosis not present

## 2014-08-09 DIAGNOSIS — E1129 Type 2 diabetes mellitus with other diabetic kidney complication: Secondary | ICD-10-CM | POA: Diagnosis not present

## 2014-08-09 DIAGNOSIS — G4733 Obstructive sleep apnea (adult) (pediatric): Secondary | ICD-10-CM | POA: Diagnosis not present

## 2014-08-09 DIAGNOSIS — G629 Polyneuropathy, unspecified: Secondary | ICD-10-CM | POA: Diagnosis not present

## 2014-08-09 DIAGNOSIS — Z6839 Body mass index (BMI) 39.0-39.9, adult: Secondary | ICD-10-CM | POA: Diagnosis not present

## 2014-08-09 DIAGNOSIS — E785 Hyperlipidemia, unspecified: Secondary | ICD-10-CM | POA: Diagnosis not present

## 2014-08-09 DIAGNOSIS — I2510 Atherosclerotic heart disease of native coronary artery without angina pectoris: Secondary | ICD-10-CM | POA: Diagnosis not present

## 2014-08-09 DIAGNOSIS — M1990 Unspecified osteoarthritis, unspecified site: Secondary | ICD-10-CM | POA: Diagnosis not present

## 2014-11-16 DIAGNOSIS — M7731 Calcaneal spur, right foot: Secondary | ICD-10-CM | POA: Diagnosis not present

## 2014-11-16 DIAGNOSIS — M7661 Achilles tendinitis, right leg: Secondary | ICD-10-CM | POA: Diagnosis not present

## 2015-01-20 DIAGNOSIS — Z23 Encounter for immunization: Secondary | ICD-10-CM | POA: Diagnosis not present

## 2015-01-23 DIAGNOSIS — E1129 Type 2 diabetes mellitus with other diabetic kidney complication: Secondary | ICD-10-CM | POA: Diagnosis not present

## 2015-01-23 DIAGNOSIS — E785 Hyperlipidemia, unspecified: Secondary | ICD-10-CM | POA: Diagnosis not present

## 2015-01-23 DIAGNOSIS — I10 Essential (primary) hypertension: Secondary | ICD-10-CM | POA: Diagnosis not present

## 2015-01-23 DIAGNOSIS — Z125 Encounter for screening for malignant neoplasm of prostate: Secondary | ICD-10-CM | POA: Diagnosis not present

## 2015-01-23 DIAGNOSIS — I2510 Atherosclerotic heart disease of native coronary artery without angina pectoris: Secondary | ICD-10-CM | POA: Diagnosis not present

## 2015-01-30 DIAGNOSIS — G4733 Obstructive sleep apnea (adult) (pediatric): Secondary | ICD-10-CM | POA: Diagnosis not present

## 2015-01-30 DIAGNOSIS — G629 Polyneuropathy, unspecified: Secondary | ICD-10-CM | POA: Diagnosis not present

## 2015-01-30 DIAGNOSIS — N183 Chronic kidney disease, stage 3 (moderate): Secondary | ICD-10-CM | POA: Diagnosis not present

## 2015-01-30 DIAGNOSIS — E669 Obesity, unspecified: Secondary | ICD-10-CM | POA: Diagnosis not present

## 2015-01-30 DIAGNOSIS — E785 Hyperlipidemia, unspecified: Secondary | ICD-10-CM | POA: Diagnosis not present

## 2015-01-30 DIAGNOSIS — Z6838 Body mass index (BMI) 38.0-38.9, adult: Secondary | ICD-10-CM | POA: Diagnosis not present

## 2015-01-30 DIAGNOSIS — E1129 Type 2 diabetes mellitus with other diabetic kidney complication: Secondary | ICD-10-CM | POA: Diagnosis not present

## 2015-01-30 DIAGNOSIS — I10 Essential (primary) hypertension: Secondary | ICD-10-CM | POA: Diagnosis not present

## 2015-01-30 DIAGNOSIS — M1990 Unspecified osteoarthritis, unspecified site: Secondary | ICD-10-CM | POA: Diagnosis not present

## 2015-01-30 DIAGNOSIS — Z1389 Encounter for screening for other disorder: Secondary | ICD-10-CM | POA: Diagnosis not present

## 2015-01-30 DIAGNOSIS — I2510 Atherosclerotic heart disease of native coronary artery without angina pectoris: Secondary | ICD-10-CM | POA: Diagnosis not present

## 2015-01-30 DIAGNOSIS — Z0000 Encounter for general adult medical examination without abnormal findings: Secondary | ICD-10-CM | POA: Diagnosis not present

## 2015-02-05 DIAGNOSIS — Z1212 Encounter for screening for malignant neoplasm of rectum: Secondary | ICD-10-CM | POA: Diagnosis not present

## 2015-04-06 DIAGNOSIS — T1490 Injury, unspecified: Secondary | ICD-10-CM | POA: Diagnosis not present

## 2015-07-17 DIAGNOSIS — E119 Type 2 diabetes mellitus without complications: Secondary | ICD-10-CM | POA: Diagnosis not present

## 2015-07-17 DIAGNOSIS — H25011 Cortical age-related cataract, right eye: Secondary | ICD-10-CM | POA: Diagnosis not present

## 2015-07-17 DIAGNOSIS — H5201 Hypermetropia, right eye: Secondary | ICD-10-CM | POA: Diagnosis not present

## 2015-08-02 DIAGNOSIS — H2511 Age-related nuclear cataract, right eye: Secondary | ICD-10-CM | POA: Diagnosis not present

## 2015-08-02 DIAGNOSIS — H25811 Combined forms of age-related cataract, right eye: Secondary | ICD-10-CM | POA: Diagnosis not present

## 2015-08-17 DIAGNOSIS — E1149 Type 2 diabetes mellitus with other diabetic neurological complication: Secondary | ICD-10-CM | POA: Diagnosis not present

## 2015-08-17 DIAGNOSIS — I2510 Atherosclerotic heart disease of native coronary artery without angina pectoris: Secondary | ICD-10-CM | POA: Diagnosis not present

## 2015-08-17 DIAGNOSIS — Z1389 Encounter for screening for other disorder: Secondary | ICD-10-CM | POA: Diagnosis not present

## 2015-08-17 DIAGNOSIS — E668 Other obesity: Secondary | ICD-10-CM | POA: Diagnosis not present

## 2015-08-17 DIAGNOSIS — N183 Chronic kidney disease, stage 3 (moderate): Secondary | ICD-10-CM | POA: Diagnosis not present

## 2015-08-17 DIAGNOSIS — E1129 Type 2 diabetes mellitus with other diabetic kidney complication: Secondary | ICD-10-CM | POA: Diagnosis not present

## 2015-08-17 DIAGNOSIS — I10 Essential (primary) hypertension: Secondary | ICD-10-CM | POA: Diagnosis not present

## 2015-08-17 DIAGNOSIS — I1310 Hypertensive heart and chronic kidney disease without heart failure, with stage 1 through stage 4 chronic kidney disease, or unspecified chronic kidney disease: Secondary | ICD-10-CM | POA: Diagnosis not present

## 2015-08-17 DIAGNOSIS — I252 Old myocardial infarction: Secondary | ICD-10-CM | POA: Diagnosis not present

## 2015-08-17 DIAGNOSIS — E7800 Pure hypercholesterolemia, unspecified: Secondary | ICD-10-CM | POA: Diagnosis not present

## 2015-08-17 DIAGNOSIS — M1990 Unspecified osteoarthritis, unspecified site: Secondary | ICD-10-CM | POA: Diagnosis not present

## 2015-08-17 DIAGNOSIS — Z6839 Body mass index (BMI) 39.0-39.9, adult: Secondary | ICD-10-CM | POA: Diagnosis not present

## 2016-01-03 DIAGNOSIS — Z23 Encounter for immunization: Secondary | ICD-10-CM | POA: Diagnosis not present

## 2016-01-30 DIAGNOSIS — E7800 Pure hypercholesterolemia, unspecified: Secondary | ICD-10-CM | POA: Diagnosis not present

## 2016-01-30 DIAGNOSIS — Z125 Encounter for screening for malignant neoplasm of prostate: Secondary | ICD-10-CM | POA: Diagnosis not present

## 2016-01-30 DIAGNOSIS — I10 Essential (primary) hypertension: Secondary | ICD-10-CM | POA: Diagnosis not present

## 2016-01-30 DIAGNOSIS — E1129 Type 2 diabetes mellitus with other diabetic kidney complication: Secondary | ICD-10-CM | POA: Diagnosis not present

## 2016-02-07 DIAGNOSIS — Z6839 Body mass index (BMI) 39.0-39.9, adult: Secondary | ICD-10-CM | POA: Diagnosis not present

## 2016-02-07 DIAGNOSIS — I10 Essential (primary) hypertension: Secondary | ICD-10-CM | POA: Diagnosis not present

## 2016-02-07 DIAGNOSIS — E669 Obesity, unspecified: Secondary | ICD-10-CM | POA: Diagnosis not present

## 2016-02-07 DIAGNOSIS — I1310 Hypertensive heart and chronic kidney disease without heart failure, with stage 1 through stage 4 chronic kidney disease, or unspecified chronic kidney disease: Secondary | ICD-10-CM | POA: Diagnosis not present

## 2016-02-07 DIAGNOSIS — N183 Chronic kidney disease, stage 3 (moderate): Secondary | ICD-10-CM | POA: Diagnosis not present

## 2016-02-07 DIAGNOSIS — Z0000 Encounter for general adult medical examination without abnormal findings: Secondary | ICD-10-CM | POA: Diagnosis not present

## 2016-02-07 DIAGNOSIS — I252 Old myocardial infarction: Secondary | ICD-10-CM | POA: Diagnosis not present

## 2016-02-07 DIAGNOSIS — E1149 Type 2 diabetes mellitus with other diabetic neurological complication: Secondary | ICD-10-CM | POA: Diagnosis not present

## 2016-02-07 DIAGNOSIS — M1990 Unspecified osteoarthritis, unspecified site: Secondary | ICD-10-CM | POA: Diagnosis not present

## 2016-02-07 DIAGNOSIS — G4733 Obstructive sleep apnea (adult) (pediatric): Secondary | ICD-10-CM | POA: Diagnosis not present

## 2016-02-07 DIAGNOSIS — M545 Low back pain: Secondary | ICD-10-CM | POA: Diagnosis not present

## 2016-02-07 DIAGNOSIS — G629 Polyneuropathy, unspecified: Secondary | ICD-10-CM | POA: Diagnosis not present

## 2016-02-08 ENCOUNTER — Telehealth: Payer: Self-pay | Admitting: Internal Medicine

## 2016-02-08 NOTE — Telephone Encounter (Signed)
02/08/2016 Received fax referral packet from [facility] for upcoming appointment with Dr. Debara Pickett on 04/08/2016. Records given to [facility]. cbr

## 2016-02-15 ENCOUNTER — Encounter: Payer: Self-pay | Admitting: Internal Medicine

## 2016-03-11 DIAGNOSIS — L219 Seborrheic dermatitis, unspecified: Secondary | ICD-10-CM | POA: Diagnosis not present

## 2016-04-08 ENCOUNTER — Ambulatory Visit (INDEPENDENT_AMBULATORY_CARE_PROVIDER_SITE_OTHER): Payer: Medicare Other | Admitting: Internal Medicine

## 2016-04-08 ENCOUNTER — Encounter: Payer: Self-pay | Admitting: Internal Medicine

## 2016-04-08 VITALS — BP 110/64 | HR 69 | Ht 73.0 in | Wt 294.2 lb

## 2016-04-08 DIAGNOSIS — Z951 Presence of aortocoronary bypass graft: Secondary | ICD-10-CM | POA: Insufficient documentation

## 2016-04-08 DIAGNOSIS — R079 Chest pain, unspecified: Secondary | ICD-10-CM | POA: Diagnosis not present

## 2016-04-08 DIAGNOSIS — I209 Angina pectoris, unspecified: Secondary | ICD-10-CM

## 2016-04-08 DIAGNOSIS — E782 Mixed hyperlipidemia: Secondary | ICD-10-CM | POA: Insufficient documentation

## 2016-04-08 DIAGNOSIS — I10 Essential (primary) hypertension: Secondary | ICD-10-CM | POA: Insufficient documentation

## 2016-04-08 DIAGNOSIS — I25119 Atherosclerotic heart disease of native coronary artery with unspecified angina pectoris: Secondary | ICD-10-CM | POA: Diagnosis not present

## 2016-04-08 MED ORDER — NITROGLYCERIN 0.4 MG SL SUBL: 0.4000 mg | SUBLINGUAL_TABLET | SUBLINGUAL | 3 refills | Status: DC | PRN

## 2016-04-08 MED ORDER — TRAMADOL HCL 50 MG PO TABS: 50.0000 mg | ORAL_TABLET | Freq: Four times a day (QID) | ORAL | 1 refills | Status: DC | PRN

## 2016-04-08 NOTE — Progress Notes (Signed)
Grossly normal   OFFICE NOTE  Chief Complaint:  Chest pain  Primary Care Physician: Haywood Pao, MD  HPI:  Blake Hurst is a 75 y.o. male with a history of diabetes, hypertension, dyslipidemia and morbid obesity and coronary artery disease with 2 prior coronary bypasses. His first chordee bypass was in 1996 and he had a redo CABG in January 2012 with LIMA to LAD, left radial to OM 2 and SVG to diagonal. EF at that time was 40-50%. He was last seen by me in the office in December 2013. At that time he was doing well but had not made any significant improvement in weight loss. He then disappeared to follow-up due to difficulty getting to [place]. He recently saw his primary care provider and is been describing chest discomfort which is similar to his prior symptoms. He feels a tightness in his chest which comes on with exertion and is relieved at rest. He was given nitroglycerin but has not taken it. He also takes tramadol which she says helps him with pain in his hips and legs and also pain in his chest.  PMHx:  Past Medical History:  Diagnosis Date  . CAD (coronary artery disease)   . Diabetes mellitus without complication ([place])   . GERD (gastroesophageal reflux disease)   . Heart disease   . 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   Marland Kitchen Hypertension   . Neuropathy ([place])   . OA (osteoarthritis) of knee   . Obesity (BMI 30-39.9)   . Obesity hypoventilation syndrome ([place]) 01/24/2013  . S/P CABG x 3 1992    Past Surgical History:  Procedure Laterality Date  . open heart surgeries    . ruptured belly button    . TONSILLECTOMY      FAMHx:  Family History  Problem Relation Age of Onset  . Cancer - Prostate Father   . Diabetes Sister   . Hypertension Sister   . Diabetes Brother   . Cancer - Prostate Brother   . Heart attack Brother     SOCHx:   reports that he has quit smoking. He has never used  smokeless tobacco. He reports that he drinks alcohol. He reports that he does not use drugs.  ALLERGIES:  Allergies  Allergen Reactions  . Niacin And Related     headaches    ROS: Pertinent items noted in HPI and remainder of comprehensive ROS otherwise negative.  HOME MEDS: Current Outpatient Prescriptions on File Prior to Visit  Medication Sig Dispense Refill  . aspirin 81 MG tablet Take 81 mg by mouth daily.    Marland Kitchen atorvastatin (LIPITOR) 80 MG tablet One tablet daily    . clopidogrel (PLAVIX) 75 MG tablet daily.     . clotrimazole-betamethasone (LOTRISONE) cream Apply topically 2 (two) times daily. Apply to legs    . furosemide (LASIX) 40 MG tablet 40 mg. One tablet Mon,Wed., Friday    . Insulin Detemir (LEVEMIR [place]) Inject 70 Units into the skin daily.     Marland Kitchen lisinopril-hydrochlorothiazide (PRINZIDE,ZESTORETIC) 20-12.5 MG per tablet daily.     . metFORMIN (GLUCOPHAGE) 1000 MG tablet 1,000 mg 2 (two) times daily.     . metoprolol (LOPRESSOR) 50 MG tablet 2 (two) times daily.     Marland Kitchen NOVOTWIST 30G X 8 MM MISC One needle daily    . omeprazole (PRILOSEC) 20 MG capsule daily.     . pregabalin (  LYRICA) 75 MG capsule Take 75 mg by mouth 2 (two) times daily.    Marland Kitchen VICTOZA 18 MG/3ML SOPN One needle daily     No current facility-administered medications on file prior to visit.     LABS/IMAGING: No results found for this or any previous visit (from the past 48 hour(s)). No results found.  WEIGHTS: Wt Readings from Last 3 Encounters:  04/08/16 294 lb 3.2 oz (133.4 kg)  01/24/13 291 lb (132 kg)  01/24/13 294 lb (133.4 kg)    VITALS: BP 110/64   Pulse 69   Ht 6\' 1"  (1.854 m)   Wt 294 lb 3.2 oz (133.4 kg)   BMI 38.82 kg/m   EXAM: General appearance: alert, no distress and morbidly obese Neck: no carotid bruit and no JVD Lungs: clear to auscultation bilaterally Heart: regular rate and rhythm, S1, S2 normal, no murmur, click, rub or gallop Abdomen: soft, non-tender; bowel sounds  normal; no masses,  no organomegaly Extremities: edema 1+ firm bilateral edema Pulses: 2+ and symmetric Skin: Skin color, texture, turgor normal. No rashes or lesions Neurologic: GroPsych: Pleasanty normal Psych: Pleasant  EKG: Normal sinus rhythm at 69, right bundle branch block, inferior infarct pattern  ASSESSMENT: 1. Chest pain, possibly unstable angina 2. CAD status post CABG in 1996 and redo CABG in 2012, LIMA to LAD, left radial to OM 2 and SVG to diagonal 3. Morbid obesity 4. Hypertension 5. Dyslipidemia 6. Diabetes type 2  PLAN: 1.   Blake Hurst presents again for evaluation of chest pain concerning for unstable angina. He's had 2 prior CABG operations, the last was a redo procedure in January 2012. He is likely to have a short lifespan of his bypass grafts. I'm concerned about recurrent angina. He has few options other than PCI if his symptoms are reoccurring. I recommend a Lexiscan Myoview. I will also prescribe some tramadol for his generalized pain. Plan to see him back in a few weeks to review those results. Thank you for returning him to cardiovascular care.  Pixie Casino, MD, [facility] Attending Cardiologist [place] 04/08/2016, 5:35 PM

## 2016-04-08 NOTE — Patient Instructions (Addendum)
Medication Instructions:  TAKE Nitrostat as needed for chest pain START Tramadol 50mg  Take 1 tablet every 6 hours as needed   Labwork: None   Testing/Procedures: Your physician has requested that you have a lexiscan myoview. For further information please visit [email or web address]. Please follow instruction sheet, as given.  Follow-Up: Your physician recommends that you schedule a follow-up appointment in: AFTER TESTING WITH DR HILTY.  Any Other Special Instructions Will Be Listed Below (If Applicable).     If you need a refill on your cardiac medications before your next appointment, please call your pharmacy.

## 2016-04-09 ENCOUNTER — Telehealth: Payer: Self-pay | Admitting: Internal Medicine

## 2016-04-09 NOTE — Telephone Encounter (Signed)
New message  Pt is calling, wants to change his appts for 12/6 and 12/7 to [facility]  Please call back and advise

## 2016-04-14 ENCOUNTER — Other Ambulatory Visit: Payer: Self-pay

## 2016-04-14 DIAGNOSIS — R079 Chest pain, unspecified: Secondary | ICD-10-CM

## 2016-04-14 NOTE — Progress Notes (Unsigned)
Put in order for Lexi for [facility].

## 2016-04-23 ENCOUNTER — Encounter (HOSPITAL_COMMUNITY)
Admission: RE | Admit: 2016-04-23 | Discharge: 2016-04-23 | Disposition: A | Discharge status: Home / Self Care | Payer: Medicare Other | Source: Ambulatory Visit | Attending: Internal Medicine | Admitting: Internal Medicine

## 2016-04-23 ENCOUNTER — Encounter (HOSPITAL_COMMUNITY): Payer: Medicare Other

## 2016-04-23 ENCOUNTER — Ambulatory Visit (HOSPITAL_COMMUNITY): Admission: RE | Admit: 2016-04-23 | Payer: Medicare Other | Source: Ambulatory Visit

## 2016-04-23 ENCOUNTER — Encounter (HOSPITAL_COMMUNITY): Payer: Self-pay

## 2016-04-23 ENCOUNTER — Inpatient Hospital Stay (HOSPITAL_COMMUNITY): Admission: RE | Admit: 2016-04-23 | Payer: Medicare Other | Source: Ambulatory Visit

## 2016-04-23 DIAGNOSIS — R9439 Abnormal result of other cardiovascular function study: Secondary | ICD-10-CM | POA: Insufficient documentation

## 2016-04-23 DIAGNOSIS — R079 Chest pain, unspecified: Secondary | ICD-10-CM | POA: Insufficient documentation

## 2016-04-23 LAB — NM MYOCAR MULTI W/SPECT W/WALL MOTION / EF
CHL CUP NUCLEAR SDS: 6
CHL CUP RESTING HR STRESS: 68 {beats}/min
CSEPPHR: 84 {beats}/min
LVDIAVOL: 111 mL (ref 62–150)
LVSYSVOL: 56 mL
RATE: 0.39
SRS: 13
SSS: 19
TID: 0.97

## 2016-04-23 MED ORDER — TECHNETIUM TC 99M TETROFOSMIN IV KIT
30.0000 | PACK | Freq: Once | INTRAVENOUS | Status: AC | PRN
  Administered 2016-04-23: 32 via INTRAVENOUS

## 2016-04-23 MED ORDER — REGADENOSON 0.4 MG/5ML IV SOLN
INTRAVENOUS | Status: AC
Start: 2016-04-23 — End: 2016-04-23
  Administered 2016-04-23: 0.4 mg via INTRAVENOUS
  Filled 2016-04-23: qty 5

## 2016-04-23 MED ORDER — SODIUM CHLORIDE 0.9% FLUSH
INTRAVENOUS | Status: AC
Start: 2016-04-23 — End: 2016-04-23
  Administered 2016-04-23: 10 mL via INTRAVENOUS
  Filled 2016-04-23: qty 10

## 2016-04-23 MED ORDER — TECHNETIUM TC 99M TETROFOSMIN IV KIT
10.0000 | PACK | Freq: Once | INTRAVENOUS | Status: AC | PRN
  Administered 2016-04-23: 10.4 via INTRAVENOUS

## 2016-04-24 ENCOUNTER — Telehealth: Payer: Self-pay | Admitting: *Deleted

## 2016-04-24 ENCOUNTER — Other Ambulatory Visit: Payer: Self-pay | Admitting: *Deleted

## 2016-04-24 ENCOUNTER — Telehealth: Payer: Self-pay | Admitting: Internal Medicine

## 2016-04-24 ENCOUNTER — Other Ambulatory Visit (HOSPITAL_COMMUNITY): Payer: Medicare Other

## 2016-04-24 ENCOUNTER — Ambulatory Visit (HOSPITAL_COMMUNITY): Payer: Medicare Other

## 2016-04-24 DIAGNOSIS — R9439 Abnormal result of other cardiovascular function study: Secondary | ICD-10-CM

## 2016-04-24 DIAGNOSIS — Z951 Presence of aortocoronary bypass graft: Secondary | ICD-10-CM

## 2016-04-24 DIAGNOSIS — Z01818 Encounter for other preprocedural examination: Secondary | ICD-10-CM

## 2016-04-24 NOTE — Telephone Encounter (Signed)
-----   Message from Pixie Casino, MD sent at 04/24/2016 11:28 AM EST ----- Stress test abnormal suggestive of ischemia. Patient will need LHC. D/w patient today by phone, he indicated he wanted to follow-up in [place] eventually. D/w Jory Sims, NP, we will arrange for [facility] in [place] and then he can follow-up afterwards to establish care in [place] since he is having difficulty travelling to [place].  Dr. Lemmie Evens

## 2016-04-24 NOTE — Telephone Encounter (Signed)
Called Mr. Defrees at 11:15 am on 04/24/16 with the results of his stress test which are abnormal. I have recommended a left heart catheterization. I reminded him of the risks/benefits/alternatives of the procedure which he has had several times in the past and he is agreeable to proceed. We will try to schedule that in the next couple of weeks. He will need a femoral approach since he has bypass grafts and has had re-do CABG, which utilized the left radial artery. He wishes to follow-up afterwards in [place] since it is more convienent for him.   Pixie Casino, MD, [facility] Attending Cardiologist [place]

## 2016-04-24 NOTE — Telephone Encounter (Signed)
Spoke with pt, he is scheduled for LCG&P on Monday 04-28-16 with dr Claiborne Billings. He will go to the [facility] lab tomorrow for lab work. Instructions discussed with the patient but he is going to have his son give me a call so I can give those to him tomorrow. Lab orders placed.

## 2016-04-25 DIAGNOSIS — R9439 Abnormal result of other cardiovascular function study: Secondary | ICD-10-CM | POA: Diagnosis not present

## 2016-04-25 DIAGNOSIS — Z951 Presence of aortocoronary bypass graft: Secondary | ICD-10-CM | POA: Diagnosis not present

## 2016-04-25 DIAGNOSIS — Z01818 Encounter for other preprocedural examination: Secondary | ICD-10-CM | POA: Diagnosis not present

## 2016-04-25 LAB — PROTIME-INR
INR: 1
Prothrombin Time: 11 s (ref 9.0–11.5)

## 2016-04-25 LAB — CBC
HCT: 41.2 % (ref 38.5–50.0)
Hemoglobin: 14.4 g/dL (ref 13.2–17.1)
MCH: 31.6 pg (ref 27.0–33.0)
MCHC: 35 g/dL (ref 32.0–36.0)
MCV: 90.5 fL (ref 80.0–100.0)
MPV: 9.9 fL (ref 7.5–12.5)
PLATELETS: 300 10*3/uL (ref 140–400)
RBC: 4.55 MIL/uL (ref 4.20–5.80)
RDW: 14 % (ref 11.0–15.0)
WBC: 5.9 10*3/uL (ref 3.8–10.8)

## 2016-04-25 LAB — BASIC METABOLIC PANEL
BUN: 17 mg/dL (ref 7–25)
CHLORIDE: 103 mmol/L (ref 98–110)
CO2: 24 mmol/L (ref 20–31)
Calcium: 9.5 mg/dL (ref 8.6–10.3)
Creat: 1.14 mg/dL (ref 0.70–1.18)
Glucose, Bld: 126 mg/dL — ABNORMAL HIGH (ref 65–99)
POTASSIUM: 4.3 mmol/L (ref 3.5–5.3)
SODIUM: 138 mmol/L (ref 135–146)

## 2016-04-25 NOTE — Telephone Encounter (Signed)
Spoke with pt, instructions and directions discussed with the patient.

## 2016-04-28 ENCOUNTER — Telehealth: Payer: Self-pay | Admitting: Physician Assistant

## 2016-04-28 ENCOUNTER — Encounter (HOSPITAL_COMMUNITY)
Admission: RE | Disposition: A | Discharge status: Home / Self Care | Payer: Self-pay | Source: Ambulatory Visit | Attending: Cardiovascular Disease

## 2016-04-28 ENCOUNTER — Ambulatory Visit (HOSPITAL_COMMUNITY)
Admission: RE | Admit: 2016-04-28 | Discharge: 2016-04-28 | Disposition: A | Discharge status: Home / Self Care | Payer: Medicare Other | Source: Ambulatory Visit | Attending: Cardiovascular Disease | Admitting: Cardiovascular Disease

## 2016-04-28 DIAGNOSIS — Z7982 Long term (current) use of aspirin: Secondary | ICD-10-CM | POA: Insufficient documentation

## 2016-04-28 DIAGNOSIS — G4714 Hypersomnia due to medical condition: Secondary | ICD-10-CM | POA: Insufficient documentation

## 2016-04-28 DIAGNOSIS — Z8249 Family history of ischemic heart disease and other diseases of the circulatory system: Secondary | ICD-10-CM | POA: Diagnosis not present

## 2016-04-28 DIAGNOSIS — I25118 Atherosclerotic heart disease of native coronary artery with other forms of angina pectoris: Secondary | ICD-10-CM | POA: Diagnosis not present

## 2016-04-28 DIAGNOSIS — Z7902 Long term (current) use of antithrombotics/antiplatelets: Secondary | ICD-10-CM | POA: Diagnosis not present

## 2016-04-28 DIAGNOSIS — K219 Gastro-esophageal reflux disease without esophagitis: Secondary | ICD-10-CM | POA: Insufficient documentation

## 2016-04-28 DIAGNOSIS — Z6838 Body mass index (BMI) 38.0-38.9, adult: Secondary | ICD-10-CM | POA: Diagnosis not present

## 2016-04-28 DIAGNOSIS — I25718 Atherosclerosis of autologous vein coronary artery bypass graft(s) with other forms of angina pectoris: Secondary | ICD-10-CM | POA: Insufficient documentation

## 2016-04-28 DIAGNOSIS — M179 Osteoarthritis of knee, unspecified: Secondary | ICD-10-CM | POA: Diagnosis not present

## 2016-04-28 DIAGNOSIS — I2582 Chronic total occlusion of coronary artery: Secondary | ICD-10-CM | POA: Diagnosis not present

## 2016-04-28 DIAGNOSIS — Z794 Long term (current) use of insulin: Secondary | ICD-10-CM | POA: Insufficient documentation

## 2016-04-28 DIAGNOSIS — Z87891 Personal history of nicotine dependence: Secondary | ICD-10-CM | POA: Diagnosis not present

## 2016-04-28 DIAGNOSIS — E785 Hyperlipidemia, unspecified: Secondary | ICD-10-CM | POA: Diagnosis not present

## 2016-04-28 DIAGNOSIS — R9439 Abnormal result of other cardiovascular function study: Secondary | ICD-10-CM

## 2016-04-28 DIAGNOSIS — Z833 Family history of diabetes mellitus: Secondary | ICD-10-CM | POA: Diagnosis not present

## 2016-04-28 DIAGNOSIS — Z8042 Family history of malignant neoplasm of prostate: Secondary | ICD-10-CM | POA: Diagnosis not present

## 2016-04-28 DIAGNOSIS — E1140 Type 2 diabetes mellitus with diabetic neuropathy, unspecified: Secondary | ICD-10-CM | POA: Diagnosis not present

## 2016-04-28 DIAGNOSIS — J449 Chronic obstructive pulmonary disease, unspecified: Secondary | ICD-10-CM | POA: Insufficient documentation

## 2016-04-28 DIAGNOSIS — I10 Essential (primary) hypertension: Secondary | ICD-10-CM | POA: Diagnosis not present

## 2016-04-28 HISTORY — PX: CARDIAC CATHETERIZATION: SHX172

## 2016-04-28 LAB — GLUCOSE, CAPILLARY
Glucose-Capillary: 168 mg/dL — ABNORMAL HIGH (ref 65–99)
Glucose-Capillary: 122 mg/dL — ABNORMAL HIGH (ref 65–99)

## 2016-04-28 SURGERY — LEFT HEART CATH AND CORS/GRAFTS ANGIOGRAPHY
Anesthesia: LOCAL

## 2016-04-28 MED ORDER — MIDAZOLAM HCL 2 MG/2ML IJ SOLN
INTRAMUSCULAR | Status: DC | PRN
  Administered 2016-04-28: 2 mg via INTRAVENOUS

## 2016-04-28 MED ORDER — LIDOCAINE HCL (PF) 1 % IJ SOLN
INTRAMUSCULAR | Status: AC
  Filled 2016-04-28: qty 30

## 2016-04-28 MED ORDER — SODIUM CHLORIDE 0.9% FLUSH: 3.0000 mL | INTRAVENOUS | Status: DC | PRN

## 2016-04-28 MED ORDER — IOPAMIDOL (ISOVUE-370) INJECTION 76%
INTRAVENOUS | Status: AC
  Filled 2016-04-28: qty 125

## 2016-04-28 MED ORDER — HEPARIN (PORCINE) IN NACL 2-0.9 UNIT/ML-% IJ SOLN
INTRAMUSCULAR | Status: AC
  Filled 2016-04-28: qty 1000

## 2016-04-28 MED ORDER — SODIUM CHLORIDE 0.9 % IV SOLN: INTRAVENOUS | Status: DC

## 2016-04-28 MED ORDER — ASPIRIN 81 MG PO CHEW
CHEWABLE_TABLET | ORAL | Status: AC
  Filled 2016-04-28: qty 1

## 2016-04-28 MED ORDER — DIAZEPAM 5 MG PO TABS: 5.0000 mg | ORAL_TABLET | Freq: Four times a day (QID) | ORAL | Status: DC | PRN

## 2016-04-28 MED ORDER — ACETAMINOPHEN 325 MG PO TABS: 650.0000 mg | ORAL_TABLET | ORAL | Status: DC | PRN

## 2016-04-28 MED ORDER — SODIUM CHLORIDE 0.9% FLUSH: 3.0000 mL | Freq: Two times a day (BID) | INTRAVENOUS | Status: DC

## 2016-04-28 MED ORDER — IOPAMIDOL (ISOVUE-370) INJECTION 76%
INTRAVENOUS | Status: AC
  Filled 2016-04-28: qty 50

## 2016-04-28 MED ORDER — ATORVASTATIN CALCIUM 80 MG PO TABS
80.0000 mg | ORAL_TABLET | Freq: Every day | ORAL | Status: DC
  Filled 2016-04-28: qty 1

## 2016-04-28 MED ORDER — IOPAMIDOL (ISOVUE-370) INJECTION 76%
INTRAVENOUS | Status: DC | PRN
  Administered 2016-04-28: 145 mL via INTRA_ARTERIAL

## 2016-04-28 MED ORDER — ISOSORBIDE MONONITRATE ER 30 MG PO TB24
30.0000 mg | ORAL_TABLET | Freq: Every day | ORAL | Status: DC
  Filled 2016-04-28: qty 1

## 2016-04-28 MED ORDER — ONDANSETRON HCL 4 MG/2ML IJ SOLN: 4.0000 mg | Freq: Four times a day (QID) | INTRAMUSCULAR | Status: DC | PRN

## 2016-04-28 MED ORDER — ISOSORBIDE MONONITRATE ER 30 MG PO TB24: 30.0000 mg | ORAL_TABLET | Freq: Every day | ORAL | 0 refills | Status: DC

## 2016-04-28 MED ORDER — MIDAZOLAM HCL 2 MG/2ML IJ SOLN
INTRAMUSCULAR | Status: AC
  Filled 2016-04-28: qty 2

## 2016-04-28 MED ORDER — SODIUM CHLORIDE 0.9 % IV SOLN: 250.0000 mL | INTRAVENOUS | Status: DC | PRN

## 2016-04-28 MED ORDER — CLOPIDOGREL BISULFATE 75 MG PO TABS: 75.0000 mg | ORAL_TABLET | Freq: Every day | ORAL | Status: DC

## 2016-04-28 MED ORDER — SODIUM CHLORIDE 0.9 % WEIGHT BASED INFUSION: 1.0000 mL/kg/h | INTRAVENOUS | Status: DC

## 2016-04-28 MED ORDER — CLOPIDOGREL BISULFATE 75 MG PO TABS
ORAL_TABLET | ORAL | Status: AC
  Filled 2016-04-28: qty 1

## 2016-04-28 MED ORDER — LIDOCAINE HCL (PF) 1 % IJ SOLN
INTRAMUSCULAR | Status: DC | PRN
  Administered 2016-04-28: 25 mL via INTRADERMAL

## 2016-04-28 MED ORDER — ASPIRIN 81 MG PO CHEW: 81.0000 mg | CHEWABLE_TABLET | Freq: Every day | ORAL | Status: DC

## 2016-04-28 MED ORDER — FENTANYL CITRATE (PF) 100 MCG/2ML IJ SOLN
INTRAMUSCULAR | Status: DC | PRN
  Administered 2016-04-28: 50 ug via INTRAVENOUS

## 2016-04-28 MED ORDER — HEPARIN (PORCINE) IN NACL 2-0.9 UNIT/ML-% IJ SOLN
INTRAMUSCULAR | Status: DC | PRN
  Administered 2016-04-28: 1000 mL

## 2016-04-28 MED ORDER — SODIUM CHLORIDE 0.9 % WEIGHT BASED INFUSION
3.0000 mL/kg/h | INTRAVENOUS | Status: AC
  Administered 2016-04-28: 3 mL/kg/h via INTRAVENOUS

## 2016-04-28 MED ORDER — FENTANYL CITRATE (PF) 100 MCG/2ML IJ SOLN
INTRAMUSCULAR | Status: AC
  Filled 2016-04-28: qty 2

## 2016-04-28 MED ORDER — ASPIRIN 81 MG PO CHEW
81.0000 mg | CHEWABLE_TABLET | ORAL | Status: AC
Start: 2016-04-28 — End: 2016-04-28
  Administered 2016-04-28: 81 mg via ORAL

## 2016-04-28 MED ORDER — CLOPIDOGREL BISULFATE 75 MG PO TABS
75.0000 mg | ORAL_TABLET | Freq: Once | ORAL | Status: AC
  Administered 2016-04-28: 75 mg via ORAL

## 2016-04-28 SURGICAL SUPPLY — 10 items
CATH EXPO 5F IM (CATHETERS) ×2 IMPLANT
CATH INFINITI 5 FR LCB (CATHETERS) ×2 IMPLANT
CATH INFINITI 5FR MULTPACK ANG (CATHETERS) ×2 IMPLANT
KIT HEART LEFT (KITS) ×2 IMPLANT
PACK CARDIAC CATHETERIZATION (CUSTOM PROCEDURE TRAY) ×2 IMPLANT
SHEATH PINNACLE 5F 10CM (SHEATH) ×2 IMPLANT
SYR MEDRAD MARK V 150ML (SYRINGE) ×2 IMPLANT
TRANSDUCER W/STOPCOCK (MISCELLANEOUS) ×2 IMPLANT
WIRE EMERALD 3MM-J .025X260CM (WIRE) ×2 IMPLANT
WIRE EMERALD 3MM-J .035X150CM (WIRE) ×2 IMPLANT

## 2016-04-28 NOTE — Progress Notes (Signed)
16fr sheath removed rfa and direct pressure maintained 15 minutes resulting in complete hemostasis.  Sterile DSG applied to site.  Area soft without oozing or hematoma. Post care instructions given and understood and distal pulses palatable. GBG was 122

## 2016-04-28 NOTE — Discharge Instructions (Signed)
Angiogram, Care After HOLD METFORMIN FOR 48 HOURS efer to this sheet in the next few weeks. These instructions provide you with information about caring for yourself after your procedure. Your health care provider may also give you more specific instructions. Your treatment has been planned according to current medical practices, but problems sometimes occur. Call your health care provider if you have any problems or questions after your procedure. What can I expect after the procedure? After your procedure, it is typical to have the following:  Bruising at the catheter insertion site that usually fades within 1-2 weeks.  Blood collecting in the tissue (hematoma) that may be painful to the touch. It should usually decrease in size and tenderness within 1-2 weeks. Follow these instructions at home:  Take medicines only as directed by your health care provider.  You may shower 24-48 hours after the procedure or as directed by your health care provider. Remove the bandage (dressing) and gently wash the site with plain soap and water. Pat the area dry with a clean towel. Do not rub the site, because this may cause bleeding.  Do not take baths, swim, or use a hot tub until your health care provider approves.  Check your insertion site every day for redness, swelling, or drainage.  Do not apply powder or lotion to the site.  Do not lift over 10 lb (4.5 kg) for 5 days after your procedure or as directed by your health care provider.  Ask your health care provider when it is okay to:  Return to work or school.  Resume usual physical activities or sports.  Resume sexual activity.  Do not drive home if you are discharged the same day as the procedure. Have someone else drive you.  You may drive 24 hours after the procedure unless otherwise instructed by your health care provider.  Do not operate machinery or power tools for 24 hours after the procedure or as directed by your health care  provider.  If your procedure was done as an outpatient procedure, which means that you went home the same day as your procedure, a responsible adult should be with you for the first 24 hours after you arrive home.  Keep all follow-up visits as directed by your health care provider. This is important. Contact a health care provider if:  You have a fever.  You have chills.  You have increased bleeding from the catheter insertion site. Hold pressure on the site. Get help right away if:  You have unusual pain at the catheter insertion site.  You have redness, warmth, or swelling at the catheter insertion site.  You have drainage (other than a small amount of blood on the dressing) from the catheter insertion site.  The catheter insertion site is bleeding, and the bleeding does not stop after 30 minutes of holding steady pressure on the site.  The area near or just beyond the catheter insertion site becomes pale, cool, tingly, or numb. This information is not intended to replace advice given to you by your health care provider. Make sure you discuss any questions you have with your health care provider. Document Released: 11/21/2004 Document Revised: 10/11/2015 Document Reviewed: 10/06/2012 Elsevier Interactive Patient Education  2017 [employer].

## 2016-04-28 NOTE — Progress Notes (Signed)
Pt transported to ss 5

## 2016-04-28 NOTE — H&P (View-Only) (Signed)
Grossly normal   OFFICE NOTE  Chief Complaint:  Chest pain  Primary Care Physician: Haywood Pao, MD  HPI:  Blake Hurst is a 75 y.o. male with a history of diabetes, hypertension, dyslipidemia and morbid obesity and coronary artery disease with 2 prior coronary bypasses. His first chordee bypass was in 1996 and he had a redo CABG in January 2012 with LIMA to LAD, left radial to OM 2 and SVG to diagonal. EF at that time was 40-50%. He was last seen by me in the office in December 2013. At that time he was doing well but had not made any significant improvement in weight loss. He then disappeared to follow-up due to difficulty getting to [place]. He recently saw his primary care provider and is been describing chest discomfort which is similar to his prior symptoms. He feels a tightness in his chest which comes on with exertion and is relieved at rest. He was given nitroglycerin but has not taken it. He also takes tramadol which she says helps him with pain in his hips and legs and also pain in his chest.  PMHx:  Past Medical History:  Diagnosis Date  . CAD (coronary artery disease)   . Diabetes mellitus without complication ([place])   . GERD (gastroesophageal reflux disease)   . Heart disease   . 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   Marland Kitchen Hypertension   . Neuropathy ([place])   . OA (osteoarthritis) of knee   . Obesity (BMI 30-39.9)   . Obesity hypoventilation syndrome ([place]) 01/24/2013  . S/P CABG x 3 1992    Past Surgical History:  Procedure Laterality Date  . open heart surgeries    . ruptured belly button    . TONSILLECTOMY      FAMHx:  Family History  Problem Relation Age of Onset  . Cancer - Prostate Father   . Diabetes Sister   . Hypertension Sister   . Diabetes Brother   . Cancer - Prostate Brother   . Heart attack Brother     SOCHx:   reports that he has quit smoking. He has never used  smokeless tobacco. He reports that he drinks alcohol. He reports that he does not use drugs.  ALLERGIES:  Allergies  Allergen Reactions  . Niacin And Related     headaches    ROS: Pertinent items noted in HPI and remainder of comprehensive ROS otherwise negative.  HOME MEDS: Current Outpatient Prescriptions on File Prior to Visit  Medication Sig Dispense Refill  . aspirin 81 MG tablet Take 81 mg by mouth daily.    Marland Kitchen atorvastatin (LIPITOR) 80 MG tablet One tablet daily    . clopidogrel (PLAVIX) 75 MG tablet daily.     . clotrimazole-betamethasone (LOTRISONE) cream Apply topically 2 (two) times daily. Apply to legs    . furosemide (LASIX) 40 MG tablet 40 mg. One tablet Mon,Wed., Friday    . Insulin Detemir (LEVEMIR [place]) Inject 70 Units into the skin daily.     Marland Kitchen lisinopril-hydrochlorothiazide (PRINZIDE,ZESTORETIC) 20-12.5 MG per tablet daily.     . metFORMIN (GLUCOPHAGE) 1000 MG tablet 1,000 mg 2 (two) times daily.     . metoprolol (LOPRESSOR) 50 MG tablet 2 (two) times daily.     Marland Kitchen NOVOTWIST 30G X 8 MM MISC One needle daily    . omeprazole (PRILOSEC) 20 MG capsule daily.     . pregabalin (  LYRICA) 75 MG capsule Take 75 mg by mouth 2 (two) times daily.    Marland Kitchen VICTOZA 18 MG/3ML SOPN One needle daily     No current facility-administered medications on file prior to visit.     LABS/IMAGING: No results found for this or any previous visit (from the past 48 hour(s)). No results found.  WEIGHTS: Wt Readings from Last 3 Encounters:  04/08/16 294 lb 3.2 oz (133.4 kg)  01/24/13 291 lb (132 kg)  01/24/13 294 lb (133.4 kg)    VITALS: BP 110/64   Pulse 69   Ht 6\' 1"  (1.854 m)   Wt 294 lb 3.2 oz (133.4 kg)   BMI 38.82 kg/m   EXAM: General appearance: alert, no distress and morbidly obese Neck: no carotid bruit and no JVD Lungs: clear to auscultation bilaterally Heart: regular rate and rhythm, S1, S2 normal, no murmur, click, rub or gallop Abdomen: soft, non-tender; bowel sounds  normal; no masses,  no organomegaly Extremities: edema 1+ firm bilateral edema Pulses: 2+ and symmetric Skin: Skin color, texture, turgor normal. No rashes or lesions Neurologic: GroPsych: Pleasanty normal Psych: Pleasant  EKG: Normal sinus rhythm at 69, right bundle branch block, inferior infarct pattern  ASSESSMENT: 1. Chest pain, possibly unstable angina 2. CAD status post CABG in 1996 and redo CABG in 2012, LIMA to LAD, left radial to OM 2 and SVG to diagonal 3. Morbid obesity 4. Hypertension 5. Dyslipidemia 6. Diabetes type 2  PLAN: 1.   [name] presents again for evaluation of chest pain concerning for unstable angina. He's had 2 prior CABG operations, the last was a redo procedure in January 2012. He is likely to have a short lifespan of his bypass grafts. I'm concerned about recurrent angina. He has few options other than PCI if his symptoms are reoccurring. I recommend a Lexiscan Myoview. I will also prescribe some tramadol for his generalized pain. Plan to see him back in a few weeks to review those results. Thank you for returning him to cardiovascular care.  Pixie Casino, MD, [facility] Attending Cardiologist [name] 04/08/2016, 5:35 PM

## 2016-04-28 NOTE — Telephone Encounter (Signed)
Called from short stay, start Imdur 30mg  qd per Dr. Claiborne Billings.

## 2016-04-28 NOTE — Interval H&P Note (Signed)
Cath Lab Visit (complete for each Cath Lab visit)  Clinical Evaluation Leading to the Procedure:   ACS: No.  Non-ACS:    Anginal Classification: CCS III  Anti-ischemic medical therapy: Maximal Therapy (2 or more classes of medications)  Non-Invasive Test Results: No non-invasive testing performed  Prior CABG: Previous CABG      History and Physical Interval Note:  04/28/2016 9:45 AM  Blake Hurst  has presented today for surgery, with the diagnosis of abnormal stress test  The various methods of treatment have been discussed with the patient and family. After consideration of risks, benefits and other options for treatment, the patient has consented to  Procedure(s): Left Heart Cath and Cors/Grafts Angiography (N/A) as a surgical intervention .  The patient's history has been reviewed, patient examined, no change in status, stable for surgery.  I have reviewed the patient's chart and labs.  Questions were answered to the patient's satisfaction.     Shelva Majestic

## 2016-04-29 ENCOUNTER — Encounter (HOSPITAL_COMMUNITY): Payer: Self-pay | Admitting: Cardiovascular Disease

## 2016-05-01 ENCOUNTER — Ambulatory Visit: Payer: Medicare Other | Admitting: Internal Medicine

## 2016-05-06 ENCOUNTER — Ambulatory Visit: Payer: Medicare Other | Admitting: Adult Health

## 2016-06-09 ENCOUNTER — Other Ambulatory Visit: Payer: Self-pay | Admitting: Physician Assistant

## 2016-06-11 DIAGNOSIS — E162 Hypoglycemia, unspecified: Secondary | ICD-10-CM | POA: Diagnosis not present

## 2016-06-11 DIAGNOSIS — R531 Weakness: Secondary | ICD-10-CM | POA: Diagnosis not present

## 2016-06-11 DIAGNOSIS — Z6841 Body Mass Index (BMI) 40.0 and over, adult: Secondary | ICD-10-CM | POA: Diagnosis not present

## 2016-06-11 DIAGNOSIS — I209 Angina pectoris, unspecified: Secondary | ICD-10-CM | POA: Diagnosis not present

## 2016-06-11 DIAGNOSIS — I1310 Hypertensive heart and chronic kidney disease without heart failure, with stage 1 through stage 4 chronic kidney disease, or unspecified chronic kidney disease: Secondary | ICD-10-CM | POA: Diagnosis not present

## 2016-06-11 DIAGNOSIS — E11649 Type 2 diabetes mellitus with hypoglycemia without coma: Secondary | ICD-10-CM | POA: Diagnosis not present

## 2016-07-08 ENCOUNTER — Other Ambulatory Visit: Payer: Self-pay | Admitting: Physician Assistant

## 2016-07-08 DIAGNOSIS — E119 Type 2 diabetes mellitus without complications: Secondary | ICD-10-CM | POA: Diagnosis not present

## 2016-07-08 DIAGNOSIS — H524 Presbyopia: Secondary | ICD-10-CM | POA: Diagnosis not present

## 2016-07-08 DIAGNOSIS — Z961 Presence of intraocular lens: Secondary | ICD-10-CM | POA: Diagnosis not present

## 2016-07-08 DIAGNOSIS — H26491 Other secondary cataract, right eye: Secondary | ICD-10-CM | POA: Diagnosis not present

## 2016-08-07 DIAGNOSIS — E1149 Type 2 diabetes mellitus with other diabetic neurological complication: Secondary | ICD-10-CM | POA: Diagnosis not present

## 2016-08-07 DIAGNOSIS — E1129 Type 2 diabetes mellitus with other diabetic kidney complication: Secondary | ICD-10-CM | POA: Diagnosis not present

## 2016-08-07 DIAGNOSIS — I1310 Hypertensive heart and chronic kidney disease without heart failure, with stage 1 through stage 4 chronic kidney disease, or unspecified chronic kidney disease: Secondary | ICD-10-CM | POA: Diagnosis not present

## 2016-08-07 DIAGNOSIS — E782 Mixed hyperlipidemia: Secondary | ICD-10-CM | POA: Diagnosis not present

## 2016-08-07 DIAGNOSIS — Z6839 Body mass index (BMI) 39.0-39.9, adult: Secondary | ICD-10-CM | POA: Diagnosis not present

## 2016-08-07 DIAGNOSIS — E11649 Type 2 diabetes mellitus with hypoglycemia without coma: Secondary | ICD-10-CM | POA: Diagnosis not present

## 2016-08-07 DIAGNOSIS — I208 Other forms of angina pectoris: Secondary | ICD-10-CM | POA: Diagnosis not present

## 2016-08-07 DIAGNOSIS — G6289 Other specified polyneuropathies: Secondary | ICD-10-CM | POA: Diagnosis not present

## 2016-08-07 DIAGNOSIS — I25119 Atherosclerotic heart disease of native coronary artery with unspecified angina pectoris: Secondary | ICD-10-CM | POA: Diagnosis not present

## 2016-08-07 DIAGNOSIS — N183 Chronic kidney disease, stage 3 (moderate): Secondary | ICD-10-CM | POA: Diagnosis not present

## 2016-08-07 DIAGNOSIS — I10 Essential (primary) hypertension: Secondary | ICD-10-CM | POA: Diagnosis not present

## 2017-01-14 DIAGNOSIS — Z23 Encounter for immunization: Secondary | ICD-10-CM | POA: Diagnosis not present

## 2017-02-04 DIAGNOSIS — Z125 Encounter for screening for malignant neoplasm of prostate: Secondary | ICD-10-CM | POA: Diagnosis not present

## 2017-02-04 DIAGNOSIS — N183 Chronic kidney disease, stage 3 (moderate): Secondary | ICD-10-CM | POA: Diagnosis not present

## 2017-02-04 DIAGNOSIS — E1149 Type 2 diabetes mellitus with other diabetic neurological complication: Secondary | ICD-10-CM | POA: Diagnosis not present

## 2017-02-04 DIAGNOSIS — E782 Mixed hyperlipidemia: Secondary | ICD-10-CM | POA: Diagnosis not present

## 2017-02-11 DIAGNOSIS — N183 Chronic kidney disease, stage 3 (moderate): Secondary | ICD-10-CM | POA: Diagnosis not present

## 2017-05-06 DIAGNOSIS — I10 Essential (primary) hypertension: Secondary | ICD-10-CM | POA: Diagnosis not present

## 2017-05-06 DIAGNOSIS — E1129 Type 2 diabetes mellitus with other diabetic kidney complication: Secondary | ICD-10-CM | POA: Diagnosis not present

## 2017-05-06 DIAGNOSIS — I25119 Atherosclerotic heart disease of native coronary artery with unspecified angina pectoris: Secondary | ICD-10-CM | POA: Diagnosis not present

## 2017-05-06 DIAGNOSIS — E782 Mixed hyperlipidemia: Secondary | ICD-10-CM | POA: Diagnosis not present

## 2017-05-06 DIAGNOSIS — K219 Gastro-esophageal reflux disease without esophagitis: Secondary | ICD-10-CM | POA: Diagnosis not present

## 2017-05-06 DIAGNOSIS — I1310 Hypertensive heart and chronic kidney disease without heart failure, with stage 1 through stage 4 chronic kidney disease, or unspecified chronic kidney disease: Secondary | ICD-10-CM | POA: Diagnosis not present

## 2017-05-06 DIAGNOSIS — E1149 Type 2 diabetes mellitus with other diabetic neurological complication: Secondary | ICD-10-CM | POA: Diagnosis not present

## 2017-05-06 DIAGNOSIS — N183 Chronic kidney disease, stage 3 (moderate): Secondary | ICD-10-CM | POA: Diagnosis not present

## 2017-05-06 DIAGNOSIS — I208 Other forms of angina pectoris: Secondary | ICD-10-CM | POA: Diagnosis not present

## 2017-05-06 DIAGNOSIS — G252 Other specified forms of tremor: Secondary | ICD-10-CM | POA: Diagnosis not present

## 2017-05-06 DIAGNOSIS — E7800 Pure hypercholesterolemia, unspecified: Secondary | ICD-10-CM | POA: Diagnosis not present

## 2017-05-06 DIAGNOSIS — Z6838 Body mass index (BMI) 38.0-38.9, adult: Secondary | ICD-10-CM | POA: Diagnosis not present

## 2017-05-06 DIAGNOSIS — G629 Polyneuropathy, unspecified: Secondary | ICD-10-CM | POA: Diagnosis not present

## 2017-05-27 ENCOUNTER — Encounter: Payer: Self-pay | Admitting: Neurology

## 2017-05-28 ENCOUNTER — Encounter: Payer: Self-pay | Admitting: Neurology

## 2017-05-28 ENCOUNTER — Ambulatory Visit (INDEPENDENT_AMBULATORY_CARE_PROVIDER_SITE_OTHER): Payer: Medicare Other | Admitting: Neurology

## 2017-05-28 VITALS — BP 108/64 | HR 69 | Ht 73.0 in | Wt 289.0 lb

## 2017-05-28 DIAGNOSIS — R259 Unspecified abnormal involuntary movements: Secondary | ICD-10-CM

## 2017-05-28 DIAGNOSIS — R251 Tremor, unspecified: Secondary | ICD-10-CM | POA: Diagnosis not present

## 2017-05-28 DIAGNOSIS — R29818 Other symptoms and signs involving the nervous system: Secondary | ICD-10-CM | POA: Diagnosis not present

## 2017-05-28 MED ORDER — CARBIDOPA-LEVODOPA 25-100 MG PO TABS: 1.0000 | ORAL_TABLET | Freq: Three times a day (TID) | ORAL | 2 refills | Status: DC

## 2017-05-28 NOTE — Progress Notes (Signed)
SLEEP MEDICINE CLINIC   Provider:  Larey Seat, M D  Primary Care Physician:  Osborne Casco Fransico Him, MD   Referring Provider: Haywood Pao, MD    Chief Complaint  Patient presents with  . New Patient (Initial Visit)    pt having a difficulty with a tremor more in the left arm he does notice the tremor in right as well. pt states that he has difficulty with eating due to the tremor.     HPI:  Blake Hurst is a 77 y.o. male , seen here  in a new referral from Dr. Osborne Casco for Tremor , he has been senn for sleep apnea ( negative evaluation ) 5 years ago. He was sleepy in daytime. Mr. 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  He is on lisinopril, metoprolol, furosemide, and hydrochlorothiazide.  Intention tremor, left over right hand affected.  He is left-handed which means his dominant hand is affected by the trauma, and that is most often seen in Parkinson's disease.  He has dysphonia- sounding hoarse , soft voice, insists this is present for years.  However his tremor is mild at rest but mostly noted with intent of movement. He has noted exacerbation with action/ intention. He has not felt faint- but fell twice, once while washing his car and he stepped on the bumper- fell down, flat backwards- hit his head.  Another fall on a Sunday, he removed packaging material from the garage , and fell from a 2 step - staircase , sideways this time, no injury.    Chief complaint according to patient : Tremor.   Sleep habits are as follows: no REM BD - or not  aware , not a restless sleeper. He has no vivid dreams. Not fallen out of bed. No RLS.   Medical history above:   Family history: unaware - of Parkinson's Disease.   Social history: adopted daughter, biological son.   Review of Systems: Out of a complete 14 system review, the patient complains of only the following symptoms, and all other reviewed systems are negative.   Tremor    Social History   Socioeconomic History  . Marital status: Single    Spouse name: Not on file  . Number of children: 4  . Years of education: 90  . Highest education level: Not on file  Social Needs  . Financial resource strain: Not on file  . Food insecurity - worry: Not on file  . Food insecurity - inability: Not on file  . Transportation needs - medical: Not on file  . Transportation needs - non-medical: Not on file  Occupational History  . Occupation: retired    Fish farm manager: RETIRED    Comment: Editor, commissioning  Tobacco Use  . Smoking status: Former Research scientist (life sciences)  . Smokeless tobacco: Never Used  . Tobacco comment: quit 35-40 years ago  Substance and Sexual Activity  . Alcohol use: Yes    Comment: occas. drink  . Drug use: No  . Sexual activity: Not on file  Other Topics Concern  . Not on file  Social History Narrative   epworth sleepiness scale  scrore: 17    Family History  Problem Relation Age of Onset  . Cancer - Prostate Father   . Diabetes Sister   . Hypertension Sister   . Diabetes Brother   . Cancer - Prostate Brother   . Heart attack Brother     Past Medical History:  Diagnosis Date  . CAD (coronary artery disease)   . Diabetes mellitus without complication ([place])   . GERD (gastroesophageal reflux disease)   . Heart disease   . 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   Marland Kitchen Hypertension   . Neuropathy   . OA (osteoarthritis) of knee   . Obesity (BMI 30-39.9)   . Obesity hypoventilation syndrome ([place])  01/24/2013  . S/P CABG x 3 1992    Past Surgical History:  Procedure Laterality Date  . CARDIAC CATHETERIZATION N/A 04/28/2016   Procedure: Left Heart Cath and Cors/Grafts Angiography;  Surgeon: Troy Sine, MD;  Location: [facility];  Service: Cardiovascular;  Laterality: N/A;  . open heart surgeries    . ruptured belly button    . TONSILLECTOMY      Current Outpatient Medications  Medication Sig Dispense Refill  . aspirin 81 MG tablet Take 81 mg by mouth daily.    Marland Kitchen atorvastatin (LIPITOR) 80 MG tablet Take 80 mg by mouth daily at 6 PM. One tablet daily    . clopidogrel (PLAVIX) 75 MG tablet Take 75 mg by mouth daily.     . furosemide (LASIX) 40 MG tablet Take 40 mg by mouth See admin instructions. One tablet Mon,Wed., Friday    . Insulin Detemir (LEVEMIR [place]) Inject 45 Units into the skin daily.     . isosorbide mononitrate (IMDUR) 30 MG 24 hr tablet TAKE 1 TABLET BY MOUTH EVERY DAY 30 tablet 9  . lisinopril-hydrochlorothiazide (PRINZIDE,ZESTORETIC) 20-12.5 MG per tablet Take 1 tablet by mouth daily.     . metFORMIN (GLUCOPHAGE) 1000 MG tablet Take 1,000 mg by mouth 2 (two) times daily.     . metoprolol (LOPRESSOR) 50 MG tablet Take 50 mg by mouth 2 (two) times daily.     Marland Kitchen NOVOTWIST 30G X 8 MM MISC One needle daily    . omeprazole (PRILOSEC) 20 MG capsule Take 20 mg by mouth daily.     . pregabalin (LYRICA) 75 MG capsule Take 75 mg by mouth at bedtime.     . traMADol (ULTRAM) 50 MG tablet Take 1 tablet (50 mg total) by mouth every 6 (six) hours as needed. (Patient taking differently: Take 50 mg by mouth every 6 (six) hours as needed for moderate pain. ) 30 tablet 1  . VICTOZA 18 MG/3ML SOPN Inject 1.8 mg into the skin daily. One needle daily    . nitroGLYCERIN (NITROSTAT) 0.4 MG SL tablet Place 1 tablet (0.4 mg total) under the tongue every 5 (five) minutes as needed for chest pain. 25 tablet 3   No current facility-administered medications for this visit.     Allergies  as of 05/28/2017 - Review Complete 05/28/2017  Allergen Reaction Noted  . Niacin and related  01/24/2013    Vitals: BP 108/64   Pulse 69   Ht 6\' 1"  (1.854 m)   Wt 289 lb (131.1 kg)   BMI 38.13 kg/m  Last Weight:  Wt Readings from Last 1 Encounters:  05/28/17 289 lb (131.1 kg)   ALP:FXTK mass index is 38.[place]  kg/m.     Last Height:   Ht Readings from Last 1 Encounters:  05/28/17 6\' 1"  (1.854 m)    Physical exam:  General: The patient is awake, alert and appears not in acute distress. The patient is well groomed. Head: Normocephalic, atraumatic. Neck is supple. Mallampati 4,  neck circumference:17.5 Nasal airflow patent. Cardiovascular:  Regular rate and rhythm , without  murmurs or carotid bruit, and without distended neck veins. Respiratory: Lungs are clear to auscultation. Skin:  Without evidence of edema, or rash Trunk: BMI is 38. The patient's posture is stooped.   Neurologic exam : The patient is awake and alert, oriented to place and time.   Memory subjective  described as intact.  Attention span & concentration ability appears normal.  Speech is fluent,  without dysarthria, but significant dysphonia .  Mood and affect are appropriate.  Cranial nerves: Pupils are equal and briskly reactive to light. Funduscopic exam without evidence of pallor or edema.  Extraocular movements  in vertical and horizontal planes intact and without nystagmus. Visual fields by finger perimetry are intact. Hearing to finger rub intact.  Facial sensation intact to fine touch. Facial motor strength is symmetric and tongue and uvula move midline. He has a mild tremor o in the tongue and jaw.   Shoulder shrug was symmetrical.   Motor exam: Elevated  tone, symmetric muscle bulk - and weaker grip strength in left hand . Cog-wheeling biceps, wrists. Pronator drift in the left.   Sensory:  Fine touch, pinprick and vibration were tested in all extremities. He is numb to vibration over the left knee  and ankle- has edema. He can feel primary modalities over both upper extremities and right leg.   Proprioception tested in the upper extremities was normal.  Coordination: Tremor noted at rest. Tremor worse with finger to nose and dysmetria on the left more than right- but tremor is present in both hands and writs. Rapid alternating movements in the fingers/hands were slowed.  Gait and station: Patient walks without assistive device- he rose without bracing -  and is able to walk 50 yards without drifting, normal arm swing and no pill-rolling tremor. Stance is stable and normal.  Turns with 3-4 Steps in either direction . Romberg testing is positive- he swayed, but did not tip over.  Deep tendon reflexes: in the upper and lower extremities are attenuated - he has edema over both ankles.     Assessment:  After physical and neurologic examination, review of laboratory studies,  Personal review of imaging studies, reports of other /same  Imaging studies, results of polysomnography and / or neurophysiology testing and pre-existing records as far as provided in visit., my assessment is :  I appreciate Mr. Joye and his candor in describing his recent experiences, 2 falls which seem to have been precipitated by rather adventurous moves, and his determination to live independent. Mr. Claudette Head is 77 years old, lives alone but close to relatives family.  He is worried that he could fall in his own home and would not be found in time I discussed with him that a home alert in a bracelet would be a good idea for him. As I see his gait here on an even surface I am not worried about his fall risk.  I think as long as he is not handling stairs without handrail or carries loads that limit his view to the floor he is safe to ambulate.  He already owns a walker felt that  when he is shopping he likes to lean on the shopping cart for additional stabilization.  I would like for him to take his walker when he has plans to  walk longer distance indoor or outdoor, as long as it is on an even surface.  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 ST for speech - cadence. He denies dysphagia.  He insists that the left hand tremor started when he had a venous harvest from the left antibrachium - this could explain the asymmetry in tremor and may mask an essential tremor.  I will further order MRI brain- non-contrast due to co morbidities- CKD 3.  Trial on sinemet 25-100 mg tid, taken before a meal - this is a trial, not meant to become a permanent medication - depending on if he does benefit.  I plan to see him again in 4-8 weeks.   The patient was advised of the nature of the diagnosed disorder , the treatment options and the  risks for general health and wellness arising from not treating the condition.  I spent more than 50 minutes of face to face time with the patient. Greater than 50% of time was spent in counseling and coordination of care. We have discussed the diagnosis and differential and I answered the patient's questions.      Larey Seat, MD 11/24/6281, 6:62 AM  Certified in Neurology by ABPN Certified in [place] by [facility] [address], [place], [place] [address]

## 2017-05-28 NOTE — Patient Instructions (Signed)
Carbidopa; Levodopa tablets 25/100 mg   Take before a meal- at least 30 minutes before you eat or drink anything but water.  What is this medicine? CARBIDOPA; LEVODOPA (kar bi DOE pa; lee voe DOE pa) is used to treat the symptoms of Parkinson's disease. This medicine may be used for other purposes; ask your health care provider or pharmacist if you have questions. COMMON BRAND NAME(S): SINEMET, SINEMET CR What should I tell my health care provider before I take this medicine? They need to know if you have any of these conditions: -depression or other mental illness -diabetes -glaucoma -heart disease, including history of a heart attack -irregular heart beat -kidney disease -liver disease -lung or breathing disease, like asthma -melanoma or suspicious skin lesions -stomach or intestine ulcers -an unusual or allergic reaction to levodopa, carbidopa, other medicines, foods, dyes, or preservatives -pregnant or trying to get pregnant -breast-feeding How should I use this medicine? Take this medicine by mouth with a glass of water. Follow the directions on the prescription label. Swallow whole. Do not crush or chew. You may cut the tablets in half. Take your doses at regular intervals. Do not take your medicine more often than directed. Do not stop taking except on the advice of your doctor or health care professional. Talk to your pediatrician regarding the use of this medicine in children. Special care may be needed. Overdosage: If you think you have taken too much of this medicine contact a poison control center or emergency room at once. NOTE: This medicine is only for you. Do not share this medicine with others. What if I miss a dose? If you miss a dose, take it as soon as you can. If it is almost time for your next dose, take only that dose. Do not take double or extra doses. What may interact with this medicine? Do not take this medicine with any of the following medications: -MAOIs  like Marplan, Nardil, and Parnate -reserpine -tetrabenazine This medicine may also interact with the following medications: -alcohol -droperidol -entacapone -iron supplements or multivitamins with iron -isoniazid, INH -linezolid -medicines for depression, anxiety, or psychotic disturbances -medicines for high blood pressure -medicines for sleep -metoclopramide -papaverine -procarbazine -tedizolid -rasagiline -selegiline -tolcapone This list may not describe all possible interactions. Give your health care provider a list of all the medicines, herbs, non-prescription drugs, or dietary supplements you use. Also tell them if you smoke, drink alcohol, or use illegal drugs. Some items may interact with your medicine. What should I watch for while using this medicine? Visit your doctor or health care professional for regular checks on your progress. It may be several weeks or months before you feel the full benefits of this medicine. Continue to take your medicine on a regular schedule. Do not take any additional medicines for Parkinson's disease without first consulting with your health care provider. You may get drowsy or dizzy. Do not drive, use machinery, or do anything that needs mental alertness until you know how this drug affects you. Do not stand or sit up quickly, especially if you are an older patient. This reduces the risk of dizzy or fainting spells. Alcohol can make you more drowsy and dizzy. Avoid alcoholic drinks. If you find that you have sudden feelings of wanting to sleep during normal activities, like cooking, watching television, or while driving or riding in a car, you should contact your health care professional. Dennis Bast may experience a "wearing off" effect prior to the time for your next dose  of this medicine. You may also experience an "on-off" effect where the medicine apparently stops working for anything from a minute to several hours, then suddenly starts working again.  Tell your doctor or health care professional if any of these symptoms happen to you. Your dose may need adjustment. A high protein diet can slow or prevent absorption of this medicine. Avoid high protein foods near the time of taking this medicine to help to prevent these problems. Take this medicine at least 30 minutes before eating or one hour after meals. You may want to eat higher protein foods later in the day or in small amounts. Discuss your diet with your doctor or health care professional or nutritionist. If you have diabetes, you may get a false-positive result for sugar in your urine. Check with your doctor or health care professional. This medicine may discolor the urine or sweat, making it look darker or red in color. This is of no cause for concern. However, this may stain clothing or fabrics. There have been reports of increased sexual urges or other strong urges such as gambling while taking some medicines for Parkinson's disease. If you experience any of these urges while taking this medicine, you should report it to your health care provider as soon as possible. You should check your skin often for changes to moles and new growths while taking this medicine. Call your doctor if you notice any of these changes. What side effects may I notice from receiving this medicine? Side effects that you should report to your doctor or health care professional as soon as possible: -allergic reactions like skin rash, itching or hives, swelling of the face, lips, or tongue -anxiety, confusion, or nervousness -falling asleep during normal activities like driving -fast, irregular heartbeat -hallucination, loss of contact with reality -mood changes like aggressive behavior, depression -stomach pain -trouble passing urine -uncontrolled movements of the mouth, head, hands, feet, shoulders, eyelids or other unusual muscle movements Side effects that usually do not require medical attention (report to  your doctor or health care professional if they continue or are bothersome): -headache -loss of appetite -muscle twitches -nausea/vomiting -nightmares, trouble sleeping -unusually weak ot tired This list may not describe all possible side effects. Call your doctor for medical advice about side effects. You may report side effects to FDA at 1-800-FDA-1088. Where should I keep my medicine? Keep out of the reach of children. Store below 30 degrees C (86 degrees F). Keep container tightly closed. Throw away any unused medicine after the expiration date. NOTE: This sheet is a summary. It may not cover all possible information. If you have questions about this medicine, talk to your doctor, pharmacist, or health care provider.  2018 Elsevier/Gold Standard (2013-07-05 15:40:38) Tremor A tremor is trembling or shaking that you cannot control. Most tremors affect the hands or arms. Tremors can also affect the head, vocal cords, face, and other parts of the body. There are many types of tremors. Common types include:  Essential tremor. These usually occur in people over the age of 65. It may run in families and can happen in otherwise healthy people.  Resting tremor. These occur when the muscles are at rest, such as when your hands are resting in your lap. People with Parkinson disease often have resting tremors.  Postural tremor. These occur when you try to hold a pose, such as keeping your hands outstretched.  Kinetic tremor. These occur during purposeful movement, such as trying to touch a finger to your  nose.  Task-specific tremor. These may occur when you perform tasks such as handwriting, speaking, or standing.  Psychogenic tremor. These dramatically lessen or disappear when you are distracted. They can happen in people of all ages.  Some types of tremors have no known cause. Tremors can also be a symptom of nervous system problems (neurological disorders) that may occur with aging. Some  tremors go away with treatment while others do not. Follow these instructions at home: Watch your tremor for any changes. The following actions may help to lessen any discomfort you are feeling:  Take medicines only as directed by your health care provider.  Limit alcohol intake to no more than 1 drink per day for nonpregnant women and 2 drinks per day for men. One drink equals 12 oz of beer, 5 oz of wine, or 1 oz of hard liquor.  Do not use any tobacco products, including cigarettes, chewing tobacco, or electronic cigarettes. If you need help quitting, ask your health care provider.  Avoid extreme heat or cold.  Limit the amount of caffeine you consumeas directed by your health care provider.  Try to get 8 hours of sleep each night.  Find ways to manage your stress, such as meditation or yoga.  Keep all follow-up visits as directed by your health care provider. This is important.  Contact a health care provider if:  You start having a tremor after starting a new medicine.  You have tremor with other symptoms such as: ? Numbness. ? Tingling. ? Pain. ? Weakness.  Your tremor gets worse.  Your tremor interferes with your day-to-day life. This information is not intended to replace advice given to you by your health care provider. Make sure you discuss any questions you have with your health care provider. Document Released: 04/25/2002 Document Revised: 01/06/2016 Document Reviewed: 10/31/2013 Elsevier Interactive Patient Education  [employer].

## 2017-06-09 ENCOUNTER — Ambulatory Visit
Admission: RE | Admit: 2017-06-09 | Discharge: 2017-06-09 | Disposition: A | Discharge status: Home / Self Care | Payer: Medicare Other | Source: Ambulatory Visit | Attending: Neurology | Admitting: Neurology

## 2017-06-09 DIAGNOSIS — R29818 Other symptoms and signs involving the nervous system: Secondary | ICD-10-CM | POA: Diagnosis not present

## 2017-06-15 ENCOUNTER — Telehealth: Payer: Self-pay | Admitting: Neurology

## 2017-06-15 NOTE — Telephone Encounter (Signed)
Pt called back and I was able to go over the MRI results with him. Pt verbalized understanding. Pt had no questions at this time but was encouraged to call back if questions arise.

## 2017-06-15 NOTE — Telephone Encounter (Signed)
Called to discuss the patient's MRI results. No answer. LVM informing the patient to call back.

## 2017-06-15 NOTE — Telephone Encounter (Signed)
-----   Message from Larey Seat, MD sent at 06/10/2017  5:28 PM EST ----- No brainstem abnormality noted, no tumor, no bleed, nor stroke. Mild gliosis is seen with vascular changes related to hypertension, aging, and endocrine disorders.   The mentioned peri-sylvian atrophy is often associated with memory disorders, but this was not the patients clinical concern .

## 2017-06-29 ENCOUNTER — Other Ambulatory Visit: Payer: Self-pay | Admitting: Neurology

## 2017-06-29 MED ORDER — CARBIDOPA-LEVODOPA 25-100 MG PO TABS: 1.0000 | ORAL_TABLET | Freq: Three times a day (TID) | ORAL | 0 refills | Status: DC

## 2017-07-14 DIAGNOSIS — H26491 Other secondary cataract, right eye: Secondary | ICD-10-CM | POA: Diagnosis not present

## 2017-07-14 DIAGNOSIS — H524 Presbyopia: Secondary | ICD-10-CM | POA: Diagnosis not present

## 2017-07-14 DIAGNOSIS — E119 Type 2 diabetes mellitus without complications: Secondary | ICD-10-CM | POA: Diagnosis not present

## 2017-08-17 ENCOUNTER — Telehealth: Payer: Self-pay | Admitting: Neurology

## 2017-08-17 NOTE — Telephone Encounter (Signed)
Pt called to make RN aware that the carbidopa-levodopa (SINEMET IR) 25-100 MG tablet has done him no good.  Please call. If pt not home please leave message

## 2017-08-17 NOTE — Telephone Encounter (Signed)
I have called the patient to talk about this, no answer. The phone rang and rang and never went to a VM for me to leave a message If the patient calls back inform him that he was scheduled for a follow up visit for 08/19/17 at 10:30 am. I have blocked this apt back, The purpose of this apt was to discuss his care and whether the medication was working and what would be the next plan of action.   I would recommend taking the apt back and coming in so that he can discuss the next plan of care for him and review his MRI results.

## 2017-08-19 ENCOUNTER — Ambulatory Visit: Payer: Medicare Other | Admitting: Neurology

## 2017-09-10 DIAGNOSIS — H26491 Other secondary cataract, right eye: Secondary | ICD-10-CM | POA: Diagnosis not present

## 2017-09-25 ENCOUNTER — Other Ambulatory Visit: Payer: Self-pay | Admitting: Neurology

## 2017-12-18 ENCOUNTER — Other Ambulatory Visit: Payer: Self-pay | Admitting: Neurology

## 2018-01-08 DIAGNOSIS — Z23 Encounter for immunization: Secondary | ICD-10-CM | POA: Diagnosis not present

## 2018-02-05 DIAGNOSIS — E1149 Type 2 diabetes mellitus with other diabetic neurological complication: Secondary | ICD-10-CM | POA: Diagnosis not present

## 2018-02-05 DIAGNOSIS — Z125 Encounter for screening for malignant neoplasm of prostate: Secondary | ICD-10-CM | POA: Diagnosis not present

## 2018-02-05 DIAGNOSIS — E782 Mixed hyperlipidemia: Secondary | ICD-10-CM | POA: Diagnosis not present

## 2018-02-05 DIAGNOSIS — N183 Chronic kidney disease, stage 3 (moderate): Secondary | ICD-10-CM | POA: Diagnosis not present

## 2018-02-15 DIAGNOSIS — G4733 Obstructive sleep apnea (adult) (pediatric): Secondary | ICD-10-CM | POA: Diagnosis not present

## 2018-02-15 DIAGNOSIS — N183 Chronic kidney disease, stage 3 (moderate): Secondary | ICD-10-CM | POA: Diagnosis not present

## 2018-02-15 DIAGNOSIS — E162 Hypoglycemia, unspecified: Secondary | ICD-10-CM | POA: Diagnosis not present

## 2018-02-15 DIAGNOSIS — I252 Old myocardial infarction: Secondary | ICD-10-CM | POA: Diagnosis not present

## 2018-02-15 DIAGNOSIS — I1310 Hypertensive heart and chronic kidney disease without heart failure, with stage 1 through stage 4 chronic kidney disease, or unspecified chronic kidney disease: Secondary | ICD-10-CM | POA: Diagnosis not present

## 2018-02-15 DIAGNOSIS — E782 Mixed hyperlipidemia: Secondary | ICD-10-CM | POA: Diagnosis not present

## 2018-02-15 DIAGNOSIS — Z6838 Body mass index (BMI) 38.0-38.9, adult: Secondary | ICD-10-CM | POA: Diagnosis not present

## 2018-02-15 DIAGNOSIS — E1129 Type 2 diabetes mellitus with other diabetic kidney complication: Secondary | ICD-10-CM | POA: Diagnosis not present

## 2018-02-15 DIAGNOSIS — Z0000 Encounter for general adult medical examination without abnormal findings: Secondary | ICD-10-CM | POA: Diagnosis not present

## 2018-02-15 DIAGNOSIS — Z1389 Encounter for screening for other disorder: Secondary | ICD-10-CM | POA: Diagnosis not present

## 2018-02-15 DIAGNOSIS — I208 Other forms of angina pectoris: Secondary | ICD-10-CM | POA: Diagnosis not present

## 2018-02-15 DIAGNOSIS — I25119 Atherosclerotic heart disease of native coronary artery with unspecified angina pectoris: Secondary | ICD-10-CM | POA: Diagnosis not present

## 2018-03-02 DIAGNOSIS — Z1212 Encounter for screening for malignant neoplasm of rectum: Secondary | ICD-10-CM | POA: Diagnosis not present

## 2018-04-19 DIAGNOSIS — M25562 Pain in left knee: Secondary | ICD-10-CM | POA: Diagnosis not present

## 2018-04-19 DIAGNOSIS — M545 Low back pain: Secondary | ICD-10-CM | POA: Diagnosis not present

## 2018-04-19 DIAGNOSIS — M25551 Pain in right hip: Secondary | ICD-10-CM | POA: Diagnosis not present

## 2018-05-10 DIAGNOSIS — M25551 Pain in right hip: Secondary | ICD-10-CM | POA: Diagnosis not present

## 2018-05-27 DIAGNOSIS — S80829A Blister (nonthermal), unspecified lower leg, initial encounter: Secondary | ICD-10-CM | POA: Diagnosis not present

## 2018-05-27 DIAGNOSIS — L03319 Cellulitis of trunk, unspecified: Secondary | ICD-10-CM | POA: Diagnosis not present

## 2018-10-04 DIAGNOSIS — R5381 Other malaise: Secondary | ICD-10-CM | POA: Diagnosis not present

## 2018-10-04 DIAGNOSIS — G4733 Obstructive sleep apnea (adult) (pediatric): Secondary | ICD-10-CM | POA: Diagnosis not present

## 2018-10-04 DIAGNOSIS — G629 Polyneuropathy, unspecified: Secondary | ICD-10-CM | POA: Diagnosis not present

## 2018-10-04 DIAGNOSIS — E1129 Type 2 diabetes mellitus with other diabetic kidney complication: Secondary | ICD-10-CM | POA: Diagnosis not present

## 2018-10-04 DIAGNOSIS — E782 Mixed hyperlipidemia: Secondary | ICD-10-CM | POA: Diagnosis not present

## 2018-10-04 DIAGNOSIS — I25119 Atherosclerotic heart disease of native coronary artery with unspecified angina pectoris: Secondary | ICD-10-CM | POA: Diagnosis not present

## 2018-10-04 DIAGNOSIS — I1310 Hypertensive heart and chronic kidney disease without heart failure, with stage 1 through stage 4 chronic kidney disease, or unspecified chronic kidney disease: Secondary | ICD-10-CM | POA: Diagnosis not present

## 2018-10-04 DIAGNOSIS — E1149 Type 2 diabetes mellitus with other diabetic neurological complication: Secondary | ICD-10-CM | POA: Diagnosis not present

## 2018-10-04 DIAGNOSIS — N183 Chronic kidney disease, stage 3 (moderate): Secondary | ICD-10-CM | POA: Diagnosis not present

## 2018-10-15 DIAGNOSIS — S80861A Insect bite (nonvenomous), right lower leg, initial encounter: Secondary | ICD-10-CM | POA: Diagnosis not present

## 2018-10-15 DIAGNOSIS — S81051A Open bite, right knee, initial encounter: Secondary | ICD-10-CM | POA: Diagnosis not present

## 2018-11-17 DIAGNOSIS — H524 Presbyopia: Secondary | ICD-10-CM | POA: Diagnosis not present

## 2018-11-17 DIAGNOSIS — Z961 Presence of intraocular lens: Secondary | ICD-10-CM | POA: Diagnosis not present

## 2018-11-17 DIAGNOSIS — E119 Type 2 diabetes mellitus without complications: Secondary | ICD-10-CM | POA: Diagnosis not present

## 2018-12-21 DIAGNOSIS — R2243 Localized swelling, mass and lump, lower limb, bilateral: Secondary | ICD-10-CM | POA: Diagnosis not present

## 2018-12-21 DIAGNOSIS — E785 Hyperlipidemia, unspecified: Secondary | ICD-10-CM | POA: Diagnosis not present

## 2018-12-21 DIAGNOSIS — E1140 Type 2 diabetes mellitus with diabetic neuropathy, unspecified: Secondary | ICD-10-CM | POA: Insufficient documentation

## 2018-12-21 DIAGNOSIS — I10 Essential (primary) hypertension: Secondary | ICD-10-CM | POA: Diagnosis not present

## 2018-12-21 DIAGNOSIS — I25708 Atherosclerosis of coronary artery bypass graft(s), unspecified, with other forms of angina pectoris: Secondary | ICD-10-CM | POA: Diagnosis not present

## 2019-01-04 DIAGNOSIS — E1140 Type 2 diabetes mellitus with diabetic neuropathy, unspecified: Secondary | ICD-10-CM | POA: Diagnosis not present

## 2019-01-04 DIAGNOSIS — I25119 Atherosclerotic heart disease of native coronary artery with unspecified angina pectoris: Secondary | ICD-10-CM | POA: Diagnosis not present

## 2019-01-04 DIAGNOSIS — R2243 Localized swelling, mass and lump, lower limb, bilateral: Secondary | ICD-10-CM | POA: Diagnosis not present

## 2019-01-04 DIAGNOSIS — Z23 Encounter for immunization: Secondary | ICD-10-CM | POA: Diagnosis not present

## 2019-01-17 ENCOUNTER — Encounter: Payer: Self-pay | Admitting: Cardiology

## 2019-01-17 ENCOUNTER — Ambulatory Visit (INDEPENDENT_AMBULATORY_CARE_PROVIDER_SITE_OTHER): Payer: Medicare Other | Admitting: Cardiology

## 2019-01-17 ENCOUNTER — Other Ambulatory Visit: Payer: Self-pay

## 2019-01-17 VITALS — BP 111/68 | HR 70 | Temp 96.4°F | Ht 79.0 in | Wt 292.0 lb

## 2019-01-17 DIAGNOSIS — Z136 Encounter for screening for cardiovascular disorders: Secondary | ICD-10-CM

## 2019-01-17 DIAGNOSIS — I25708 Atherosclerosis of coronary artery bypass graft(s), unspecified, with other forms of angina pectoris: Secondary | ICD-10-CM

## 2019-01-17 DIAGNOSIS — R0989 Other specified symptoms and signs involving the circulatory and respiratory systems: Secondary | ICD-10-CM | POA: Insufficient documentation

## 2019-01-17 DIAGNOSIS — R600 Localized edema: Secondary | ICD-10-CM | POA: Diagnosis not present

## 2019-01-17 NOTE — Progress Notes (Signed)
Patient referred by Pieter Partridge, PA for coronary artery disease  Subjective:   Blake Hurst, male    DOB: 06-03-1940, 78 y.o.   MRN: [id number]   Chief Complaint  Patient presents with   Coronary Artery Disease    pt c/o angina on exertion   New Patient (Initial Visit)    HPI  78 year old Caucasian male with hypertension, type 2 diabetes mellitus, hyperlipidemia, morbid obesity, coronary artery disease CABG 1996, redo CABG 2012, with patent LIMA-LAD, right radial-OM, occluded SVG-RCA and SVG-diagonal, with severe native vessel disease, here to establish cardiac care for management of coronary artery disease.  Patient [place] by himself [place] outside [place].  His physical activity is limited to doing some yard work outside his mobile home.  He reports that his physical activity is gradually decreased over the past 2-3 years.  He has occasional episodes of pressure across the chest with physical activity that gets better with rest, and use of tramadol.  His episodes occur 2-3 times a month.  He carries sublingual nitroglycerin, but has not had to use it.  He does endorse bilateral leg swelling.  He was recently recommended Lasix 40 mg daily, which previously was every other day.  He did have significant increase in his creatinine, and was asked to stop Lasix altogether.  Patient also complains of neuropathy in his feet and paresthesias.    Patient is originally from [place], underwent CABG in 1992, and redo CABG at [facility] in 2012.  Not able to find operative note for his second surgery. Patient was previously followed by Dr. Debara Pickett with [facility] heart care, last seen by him in 2017.  He last underwent coronary artery bypass graft angiography in December 2017 by Dr. Claiborne Billings.  In spring 2020, patient had one episode of sudden generalized weakness, and fall face down.  He did not lose consciousness completely.  It took him a few minutes to gather  strength to get up.  This episode has not occurred since.  He still drives, exercising caution. Patient has 1 son and daughter-in-law who check on him recently.  Patient is compliant with his medical therapy.  He checks his blood sugar regularly.   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    Hypertension    Neuropathy    OA (osteoarthritis) of knee    Obesity (BMI 30-39.9)    Obesity hypoventilation syndrome ([place]) 01/24/2013   S/P CABG x 3 1992     Past Surgical History:  Procedure Laterality Date   CARDIAC CATHETERIZATION N/A 04/28/2016   Procedure: Left Heart Cath and Cors/Grafts Angiography;  Surgeon: Troy Sine, MD;  Location: [facility];  Service: Cardiovascular;  Laterality: N/A;   open heart surgeries     ruptured belly button     TONSILLECTOMY       Social History   Socioeconomic History   Marital status: Single    Spouse name: Not on file   Number of children: 4   Years of education: 12   Highest education level: Not on file  Occupational History   Occupation: retired    Fish farm manager: RETIRED    Comment: Editor, commissioning  Social Designer, fashion/clothing strain: Not on file   Food insecurity    Worry: Not on file    Inability: Not on file   Transportation needs    Medical: Not on file    Non-medical: Not on file  Tobacco Use   Smoking status: Former Smoker   Smokeless tobacco: Never Used   Tobacco comment: quit 35-40 years ago  Substance and Sexual Activity   Alcohol use: Yes    Comment: occas. drink   Drug use: No   Sexual activity: Not on file  Lifestyle   Physical activity    Days per week: Not on file    Minutes per session: Not on file   Stress: Not on file    Relationships   Social connections    Talks on phone: Not on file    Gets together: Not on file    Attends religious service: Not on file    Active member of club or organization: Not on file    Attends meetings of clubs or organizations: Not on file    Relationship status: Not on file   Intimate partner violence    Fear of current or ex partner: Not on file    Emotionally abused: Not on file    Physically abused: Not on file    Forced sexual activity: Not on file  Other Topics Concern   Not on file  Social History Narrative   epworth sleepiness scale scrore: 46     Family History  Problem Relation Age of Onset   Cancer - Prostate Father    Diabetes Sister    Hypertension Sister    Diabetes Brother    Cancer - Prostate Brother    Heart attack Brother      Current Outpatient Medications on File Prior to Visit  Medication Sig Dispense Refill   aspirin 81 MG tablet Take 81 mg by mouth daily.     atorvastatin (LIPITOR) 80 MG tablet Take 80 mg by mouth daily at 6 PM. One tablet daily     carbidopa-levodopa (SINEMET IR) 25-100 MG tablet TAKE 1 TABLET BY MOUTH THREE TIMES A DAY 270 tablet 0   clopidogrel (PLAVIX) 75 MG tablet Take 75 mg by mouth daily.      furosemide (LASIX) 40 MG tablet Take 40 mg by mouth See admin instructions. One tablet Mon,Wed., Friday     Insulin Detemir (LEVEMIR [place]) Inject 45 Units into the skin daily.      isosorbide mononitrate (IMDUR) 30 MG 24 hr tablet TAKE 1 TABLET BY MOUTH EVERY DAY 30 tablet 9   lisinopril-hydrochlorothiazide (PRINZIDE,ZESTORETIC) 20-12.5 MG per tablet Take 1 tablet by mouth daily.      metFORMIN (GLUCOPHAGE) 1000 MG tablet Take 1,000 mg by mouth 2 (two) times daily.      metoprolol (LOPRESSOR) 50 MG tablet Take 50 mg by mouth 2 (two) times daily.      nitroGLYCERIN (NITROSTAT) 0.4 MG SL tablet Place 1 tablet (0.4 mg total) under the tongue every 5 (five) minutes as needed for chest pain. 25 tablet 3    NOVOTWIST 30G X 8 MM MISC One needle daily     omeprazole (PRILOSEC) 20 MG capsule Take 20 mg by mouth daily.      pregabalin (LYRICA) 75 MG capsule Take 75 mg by mouth at bedtime.      traMADol (ULTRAM) 50 MG tablet Take 1 tablet (50 mg total) by mouth every 6 (six) hours as needed. (Patient  taking differently: Take 50 mg by mouth every 6 (six) hours as needed for moderate pain. ) 30 tablet 1   VICTOZA 18 MG/3ML SOPN Inject 1.8 mg into the skin daily. One needle daily     No current facility-administered medications on file prior to visit.     Cardiovascular studies:  EKG 01/17/2019: Sinus rhythm 69 bpm. Right bundle branch block.  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  No significant change compared to previous EKG in 2017.   Coronary and bypass graft angiography 04/28/2016: LM: Normal LAD: 50% ostial, 80% mid LAD stenoses, 90% ostial septal perforator stneosis. LCx: 100% ostial occlusion RCA: Small caliber vessel. Severe RV marginal stenosis LIMA-LAD: Patent Radial artery-OM: Patent SVG-RCA: Occluded SVG-???: Prior stents. Occluded  Nuclear stress test 04/23/2016:  No diagnostic ST segment changes to indicate ischemia.  Small, moderate intensity, apical to basal inferior defect that exhibits partial reversibility apex and fixed toward the base. This is consistent with scar with peri-infarct ischemia towards the apex.  Moderate-sized, severe intensity, mid to basal inferolateral and anterolateral defect that is partially reversible throughout and consistent with moderate ischemic territory.  This is an intermediate risk study.  Nuclear stress EF: 49%.     Recent labs: 01/05/2019: BUN/Cr 33/1.58. eGFR 41/ Na/K 139/4.3.   12/21/2018: Cr 1.32. eGFR 51. HbA1C 7.2%. Chol 214, TG 166, HDL 33, LDL 69.   Review of Systems  Constitution: Positive for malaise/fatigue. Negative for decreased appetite, weight gain and weight loss.  HENT: Negative for congestion.   Eyes:  Negative for visual disturbance.  Cardiovascular: Positive for dyspnea on exertion (stable), leg swelling (Improved) and near-syncope (One episdoe in spring 2020). Negative for chest pain, palpitations and syncope.  Respiratory: Negative for cough.   Endocrine: Negative for cold intolerance.  Hematologic/Lymphatic: Does not bruise/bleed easily.  Skin: Negative for itching and rash.  Musculoskeletal: Negative for myalgias.  Gastrointestinal: Negative for abdominal pain, nausea and vomiting.  Genitourinary: Negative for dysuria.  Neurological: Positive for paresthesias. Negative for dizziness and weakness.  Psychiatric/Behavioral: The patient is not nervous/anxious.   All other systems reviewed and are negative.        Vitals:   01/17/19 1344  BP: 111/68  Pulse: 70  Temp: (!) 96.4 F (35.8 C)  SpO2: 95%     Body mass index is 32.9 kg/m. Filed Weights   01/17/19 1344  Weight: 292 lb (132.5 kg)     Objective:   Physical Exam  Constitutional: He is oriented to person, place, and time. He appears well-developed and well-nourished. No distress.  Moderately obese  HENT:  Head: Normocephalic and atraumatic.  Eyes: Pupils are equal, round, and reactive to light. Conjunctivae are normal.  Neck: No JVD present.  Cardiovascular: Normal rate and regular rhythm.  No murmur heard. Pulses:      Carotid pulses are on the left side with bruit.      Femoral pulses are 2+ on the right side and 2+ on the left side.      Popliteal pulses are 1+ on the right side and 1+ on the left side.       Dorsalis pedis pulses are 0 on the right side and 0 on the left side.       Posterior tibial pulses are 0 on the right side and 0 on the left side.  Delayed capillary refill, without any ulcers, wounds, gangrene.  Pulmonary/Chest: Effort normal and breath sounds normal. He has no wheezes. He has no rales.  Abdominal: Soft. Bowel sounds  are normal. There is no rebound.  Musculoskeletal:         General: Edema (2+ b/l) present.  Lymphadenopathy:    He has no cervical adenopathy.  Neurological: He is alert and oriented to person, place, and time. No cranial nerve deficit.  Skin: Skin is warm and dry.  Psychiatric: He has a normal mood and affect.  Nursing note and vitals reviewed.         Assessment & Recommendations:   78 year old Caucasian male with hypertension, type 2 diabetes mellitus, hyperlipidemia, morbid obesity, coronary artery disease CABG 1996, redo CABG 2012, with patent LIMA-LAD, right radial-OM, occluded SVG-RCA and SVG-diagonal, with severe native vessel disease, here to establish cardiac care for management of coronary artery disease.  CAD with stable angina: H/o CABG and redo CABG, 2/4 grafts patent, severe native vessel CAD on cath in 2017. Angina symptoms fairly well controlled with current medical therapy.  Continue aspirin, Plavix, statin, metoprolol, Imdur. Continue lisinopril-HCTZ for now.  Exertional dyspnea, leg swelling: Stable dyspnea.  Variable leg swelling in the recent past. Recommend echocardiogram to assess cardiac function. I have resumed lasix at 1/2 tab of 40 mg daily. Will need to monitor renal function. He has f/u w/his PCP later this week.   AAA screening: Former smoker. Will obtain AAA [place]  Presyncope: Episode possibly related to dehydration. Nonetheless, given left carotid bruit, will obtain carotid US  Decreased peripheral pulses: Delayed capillary refill, without any ulcers, wounds, gangrene. Will obtain baseline ABI.  DM:  Fairly well controlled. Continue management as per PCP   Thank you for referring the patient to [place]. Please feel free to contact with any questions.  Nigel Mormon, MD [facility] Pager: [phone] Office: [phone] If no answer Cell [phone]

## 2019-01-19 DIAGNOSIS — N289 Disorder of kidney and ureter, unspecified: Secondary | ICD-10-CM | POA: Diagnosis not present

## 2019-02-17 ENCOUNTER — Ambulatory Visit (INDEPENDENT_AMBULATORY_CARE_PROVIDER_SITE_OTHER): Payer: Medicare Other

## 2019-02-17 ENCOUNTER — Other Ambulatory Visit: Payer: 59

## 2019-02-17 ENCOUNTER — Other Ambulatory Visit: Payer: Self-pay

## 2019-02-17 ENCOUNTER — Ambulatory Visit: Payer: Medicare Other

## 2019-02-17 ENCOUNTER — Telehealth: Payer: 59 | Admitting: Cardiology

## 2019-02-17 DIAGNOSIS — R600 Localized edema: Secondary | ICD-10-CM | POA: Diagnosis not present

## 2019-02-17 DIAGNOSIS — R0989 Other specified symptoms and signs involving the circulatory and respiratory systems: Secondary | ICD-10-CM | POA: Diagnosis not present

## 2019-02-17 NOTE — Progress Notes (Signed)
Patient referred by Pieter Partridge, PA for coronary artery disease  Subjective:   Blake Hurst, male    DOB: Jan 16, 1941, 78 y.o.   MRN: [id number]   Chief Complaint  Patient presents with  . Coronary Artery Disease  . Follow-up    HPI  78 year old Caucasian male with hypertension, type 2 diabetes mellitus, hyperlipidemia, morbid obesity, coronary artery disease CABG 1996, redo CABG 2012, with patent LIMA-LAD, right radial-OM, occluded SVG-RCA and SVG-diagonal, with severe native vessel disease.  Echocardiogram showed moderate LVH, EF 45%, grade 1 DD, no significant valvular abnormality. Carotid duplex showed mild b/l carotid stenosis. Bilateral ABI was normal at 1.0, although with biphasic and diminished waveform. AAA duplex could not be performed due to technical difficulty due to large body habitus.    He has had no new symptoms or complaints since his last visit.  He continues to have bilateral lower extremity edema.  He has not had any blood work since his last visit.  His weight is up since previous visit, but his also wearing much heavier clothing compared to previous visit.    Initial office note: Patient lives by himself in [place] outside [place].  His physical activity is limited to doing some yard work outside his mobile home.  He reports that his physical activity is gradually decreased over the past 2-3 years.  He has occasional episodes of pressure across the chest with physical activity that gets better with rest, and use of tramadol.  His episodes occur 2-3 times a month.  He carries sublingual nitroglycerin, but has not had to use it.  He does endorse bilateral leg swelling.  He was recently recommended Lasix 40 mg daily, which previously was every other day.  He did have significant increase in his creatinine, and was asked to stop Lasix altogether.  Patient also complains of neuropathy in his feet and paresthesias.    Patient is originally from  [place], underwent CABG in 1992, and redo CABG at [facility] in 2012.  Not able to find operative note for his second surgery. Patient was previously followed by Dr. Debara Pickett with [facility] heart care, last seen by him in 2017.  He last underwent coronary artery bypass graft angiography in December 2017 by Dr. Claiborne Billings.  In spring 2020, patient had one episode of sudden generalized weakness, and fall face down.  He did not lose consciousness completely.  It took him a few minutes to gather strength to get up.  This episode has not occurred since.  He still drives, exercising caution. Patient has 1 son and daughter-in-law who check on him recently.  Patient is compliant with his medical therapy.  He checks his blood sugar regularly.   Past Medical History:  Diagnosis Date  . CAD (coronary artery disease)   . Diabetes mellitus without complication ([place])   . GERD (gastroesophageal reflux disease)   . Heart disease   . 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   Marland Kitchen Hypertension   . Neuropathy   . OA (osteoarthritis) of knee   . Obesity (BMI 30-39.9)   . Obesity hypoventilation syndrome ([place]) 01/24/2013  . S/P CABG x 3 1992     Past Surgical History:  Procedure Laterality Date  . CARDIAC CATHETERIZATION N/A 04/28/2016   Procedure: Left Heart Cath and Cors/Grafts Angiography;  Surgeon: Troy Sine, MD;  Location: [facility];  Service: Cardiovascular;  Laterality: N/A;  . open heart surgeries    . ruptured belly button    . TONSILLECTOMY       Social History   Socioeconomic History  . Marital status: Single    Spouse name: Not on file  . Number of children: 4  . Years of education: 77  . Highest education level: Not on file  Occupational History  . Occupation: retired    Fish farm manager: RETIRED    Comment: Editor, commissioning  Social Needs  . Financial resource strain: Not on file  . Food insecurity     Worry: Not on file    Inability: Not on file  . Transportation needs    Medical: Not on file    Non-medical: Not on file  Tobacco Use  . Smoking status: Former Research scientist (life sciences)  . Smokeless tobacco: Never Used  . Tobacco comment: quit 35-40 years ago  Substance and Sexual Activity  . Alcohol use: Yes    Comment: occas. drink  . Drug use: No  . Sexual activity: Not on file  Lifestyle  . Physical activity    Days per week: Not on file    Minutes per session: Not on file  . Stress: Not on file  Relationships  . Social Herbalist on phone: Not on file    Gets together: Not on file    Attends religious service: Not on file    Active member of club or organization: Not on file    Attends meetings of clubs or organizations: Not on file    Relationship status: Not on file  . Intimate partner violence    Fear of current or ex partner: Not on file    Emotionally abused: Not on file    Physically abused: Not on file    Forced sexual activity: Not on file  Other Topics Concern  . Not on file  Social History Narrative   epworth sleepiness scale scrore: 17     Family History  Problem Relation Age of Onset  . Cancer - Prostate Father   . Heart attack Father   . Diabetes Sister   . Hypertension Sister   . Diabetes Brother   . Cancer - Prostate Brother   . Heart attack Brother   . Diabetes Paternal Aunt      Current Outpatient Medications on File Prior to Visit  Medication Sig Dispense Refill  . aspirin 81 MG tablet Take 81 mg by mouth daily.    Marland Kitchen atorvastatin (LIPITOR) 80 MG tablet Take 80 mg by mouth daily at 6 PM. One tablet daily    . clopidogrel (PLAVIX) 75 MG tablet Take 75 mg by mouth daily.     . furosemide (LASIX) 40 MG tablet Take 40 mg by mouth See admin instructions. 1/2 pill every day    . Insulin Detemir (LEVEMIR [place]) Inject 45 Units into the skin daily.     . isosorbide mononitrate (IMDUR) 30 MG 24 hr tablet TAKE 1 TABLET BY MOUTH EVERY DAY 30 tablet 9  .  lisinopril-hydrochlorothiazide (PRINZIDE,ZESTORETIC) 20-12.5 MG per tablet Take 1 tablet by mouth daily.     . metFORMIN (GLUCOPHAGE) 1000 MG tablet Take 1,000 mg by mouth 2 (two) times daily.     . metoprolol (LOPRESSOR) 50 MG tablet Take 50 mg by mouth 2 (two) times daily.     . nitroGLYCERIN (NITROSTAT) 0.4 MG SL tablet Place 1 tablet (0.4 mg total) under the tongue every 5 (five)  minutes as needed for chest pain. 25 tablet 3  . NOVOTWIST 30G X 8 MM MISC One needle daily    . omeprazole (PRILOSEC) 20 MG capsule Take 20 mg by mouth daily.     . pregabalin (LYRICA) 75 MG capsule Take 75 mg by mouth at bedtime.     . traMADol (ULTRAM) 50 MG tablet Take 1 tablet (50 mg total) by mouth every 6 (six) hours as needed. (Patient taking differently: Take 50 mg by mouth every 6 (six) hours as needed for moderate pain. ) 30 tablet 1  . VICTOZA 18 MG/3ML SOPN Inject 1.8 mg into the skin daily. One needle daily     No current facility-administered medications on file prior to visit.     Cardiovascular studies:  ABI 02/17/2019 (Preliminary result): Mildly abnormal PVR waveforms of the right ankle. Moderately abnormal spectral waveforms of the left ankle. Resting ABI 1.0. Moderately abnormal PVR waveforms of the left ankle. Resting ABI 1.0  Carotid duplex 02/17/2019 (Preliminary result): Mild stenosis in the left external carotid artery (<50%). Antegrade right vertebral artery flow. Antegrade left vertebral artery flow. Follow up in one year is appropriate if clinically indicated.  Echocardiogram 02/17/2019: Left ventricle cavity is normal in size. Moderate concentric hypertrophy of the left ventricle. Abnormal septal wall motion due to post-operative coronary artery bypass graft. Mildly depressed LV systolic function with EF 45%. Doppler evidence of grade I (impaired) diastolic dysfunction, normal LAP.  Left atrial cavity is mildly dilated. Structurally normal trileaflet aortic valve with no  regurgitation noted. Trace aortic valve stenosis. Trace MR, trace TR, trace PI. Normal right atrial pressure.   EKG 01/17/2019: Sinus rhythm 69 bpm. Right bundle branch block.  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  No significant change compared to previous EKG in 2017.   Coronary and bypass graft angiography 04/28/2016: LM: Normal LAD: 50% ostial, 80% mid LAD stenoses, 90% ostial septal perforator stneosis. LCx: 100% ostial occlusion RCA: Small caliber vessel. Severe RV marginal stenosis LIMA-LAD: Patent Radial artery-OM: Patent SVG-RCA: Occluded SVG-???: Prior stents. Occluded  Nuclear stress test 04/23/2016:  No diagnostic ST segment changes to indicate ischemia.  Small, moderate intensity, apical to basal inferior defect that exhibits partial reversibility apex and fixed toward the base. This is consistent with scar with peri-infarct ischemia towards the apex.  Moderate-sized, severe intensity, mid to basal inferolateral and anterolateral defect that is partially reversible throughout and consistent with moderate ischemic territory.  This is an intermediate risk study.  Nuclear stress EF: 49%.     Recent labs: 01/05/2019: BUN/Cr 33/1.58. eGFR 41/ Na/K 139/4.3.   12/21/2018: Cr 1.32. eGFR 51. HbA1C 7.2%. Chol 214, TG 166, HDL 33, LDL 69.   Review of Systems  Constitution: Positive for malaise/fatigue. Negative for decreased appetite, weight gain and weight loss.  HENT: Negative for congestion.   Eyes: Negative for visual disturbance.  Cardiovascular: Positive for dyspnea on exertion (stable), leg swelling (Improved) and near-syncope (One episdoe in spring 2020). Negative for chest pain, palpitations and syncope.  Respiratory: Negative for cough.   Endocrine: Negative for cold intolerance.  Hematologic/Lymphatic: Does not bruise/bleed easily.  Skin: Negative for itching and rash.  Musculoskeletal: Negative for myalgias.  Gastrointestinal: Negative for abdominal  pain, nausea and vomiting.  Genitourinary: Negative for dysuria.  Neurological: Positive for paresthesias. Negative for dizziness and weakness.  Psychiatric/Behavioral: The patient is not nervous/anxious.   All other systems reviewed and are negative.        Vitals:   02/23/19 0957  BP: 128/66  Pulse: 76  Temp: 98 F (36.7 C)  SpO2: 97%     Body mass index is 39.45 kg/m. Filed Weights   02/23/19 0957  Weight: 135.6 kg     Objective:   Physical Exam  Constitutional: He is oriented to person, place, and time. He appears well-developed and well-nourished. No distress.  Moderately obese  HENT:  Head: Normocephalic and atraumatic.  Eyes: Pupils are equal, round, and reactive to light. Conjunctivae are normal.  Neck: No JVD present.  Cardiovascular: Normal rate and regular rhythm.  No murmur heard. Pulses:      Carotid pulses are on the left side with bruit.      Femoral pulses are 2+ on the right side and 2+ on the left side.      Popliteal pulses are 1+ on the right side and 1+ on the left side.       Dorsalis pedis pulses are 0 on the right side and 0 on the left side.       Posterior tibial pulses are 0 on the right side and 0 on the left side.  Delayed capillary refill, without any ulcers, wounds, gangrene.  Pulmonary/Chest: Effort normal and breath sounds normal. He has no wheezes. He has no rales.  Abdominal: Soft. Bowel sounds are normal. There is no rebound.  Musculoskeletal:        General: Edema (2+ b/l) present.  Lymphadenopathy:    He has no cervical adenopathy.  Neurological: He is alert and oriented to person, place, and time. No cranial nerve deficit.  Skin: Skin is warm and dry.  Psychiatric: He has a normal mood and affect.  Nursing note and vitals reviewed.         Assessment & Recommendations:   78 year old Caucasian male with hypertension, type 2 diabetes mellitus, hyperlipidemia, morbid obesity, coronary artery disease CABG 1996, redo  CABG 2012, with patent LIMA-LAD, right radial-OM, occluded SVG-RCA and SVG-diagonal, with severe native vessel disease, here to establish cardiac care for management of coronary artery disease.  CAD with stable angina: H/o CABG and redo CABG, 2/4 grafts patent, severe native vessel CAD on cath in 2017. Angina symptoms fairly well controlled with current medical therapy.  Continue aspirin, Plavix, statin, metoprolol, Imdur. Continue lisinopril-HCTZ for now.  Exertional dyspnea, leg swelling: Stable dyspnea.  Variable leg swelling in the recent past. Echocardiogram shows grade 1 diastolic dysfunction, which could partially explain his leg swelling. Continue Lasix 1/2 tab of 40 mg daily for now.  I will check his basic metabolic panel today.  Depending on his renal function, he may need referral to nephrology.   Decreased peripheral pulses: Normal ABI with abnormal waveform.  I suspect he has hardened vessels due to diabetes.  Continue medical management.  No indication for invasive work-up at this time. AAA screening ultrasound could not be performed due to body habitus.  I would like to hold off performing any contrast CT studies due to his tenuous renal function.  DM:  Fairly well controlled. Continue management as per PCP He has questions regarding his insulin dose.  I have encouraged him to discuss this with his PCP.  Follow-up in 6 weeks.  Nigel Mormon, MD [facility] Pager: [phone] Office: [phone] If no answer Cell [phone]

## 2019-02-23 ENCOUNTER — Other Ambulatory Visit: Payer: Self-pay

## 2019-02-23 ENCOUNTER — Other Ambulatory Visit (HOSPITAL_COMMUNITY): Payer: Self-pay | Admitting: Cardiology

## 2019-02-23 ENCOUNTER — Telehealth: Payer: 59 | Admitting: Cardiology

## 2019-02-23 ENCOUNTER — Ambulatory Visit (INDEPENDENT_AMBULATORY_CARE_PROVIDER_SITE_OTHER): Payer: Medicare Other | Admitting: Cardiology

## 2019-02-23 ENCOUNTER — Encounter: Payer: Self-pay | Admitting: Cardiology

## 2019-02-23 VITALS — BP 128/66 | HR 76 | Temp 98.0°F | Ht 73.0 in | Wt 299.0 lb

## 2019-02-23 DIAGNOSIS — I25708 Atherosclerosis of coronary artery bypass graft(s), unspecified, with other forms of angina pectoris: Secondary | ICD-10-CM

## 2019-02-23 DIAGNOSIS — R0989 Other specified symptoms and signs involving the circulatory and respiratory systems: Secondary | ICD-10-CM | POA: Diagnosis not present

## 2019-02-23 DIAGNOSIS — R600 Localized edema: Secondary | ICD-10-CM | POA: Diagnosis not present

## 2019-02-24 LAB — BASIC METABOLIC PANEL
BUN/Creatinine Ratio: 13 (ref 10–24)
BUN: 20 mg/dL (ref 8–27)
CO2: 20 mmol/L (ref 20–29)
Calcium: 10.4 mg/dL — ABNORMAL HIGH (ref 8.6–10.2)
Chloride: 99 mmol/L (ref 96–106)
Creatinine, Ser: 1.49 mg/dL — ABNORMAL HIGH (ref 0.76–1.27)
GFR calc Af Amer: 51 mL/min/{1.73_m2} — ABNORMAL LOW (ref >59)
GFR calc non Af Amer: 44 mL/min/{1.73_m2} — ABNORMAL LOW (ref >59)
Glucose: 160 mg/dL — ABNORMAL HIGH (ref 65–99)
Potassium: 5 mmol/L (ref 3.5–5.2)
Sodium: 137 mmol/L (ref 134–144)

## 2019-02-24 MED ORDER — FUROSEMIDE 40 MG PO TABS: 40.0000 mg | ORAL_TABLET | Freq: Every day | ORAL | 2 refills | Status: DC

## 2019-02-24 NOTE — Progress Notes (Signed)
Pt aware.

## 2019-02-24 NOTE — Addendum Note (Signed)
Addended by: Nigel Mormon on: 02/24/2019 01:28 PM   Modules accepted: Orders

## 2019-02-24 NOTE — Progress Notes (Signed)
Creatinine is improved from last time. Recommend increasing lasix to 40 mg one tablet once a day. 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 ([facility]).  Thanks MJP

## 2019-02-24 NOTE — Progress Notes (Signed)
Called unable to leave vm its not set up

## 2019-02-24 NOTE — Progress Notes (Signed)
Pt aware please start referral; Thanks

## 2019-03-18 DIAGNOSIS — R338 Other retention of urine: Secondary | ICD-10-CM | POA: Diagnosis not present

## 2019-03-18 DIAGNOSIS — R3912 Poor urinary stream: Secondary | ICD-10-CM | POA: Diagnosis not present

## 2019-03-18 DIAGNOSIS — N401 Enlarged prostate with lower urinary tract symptoms: Secondary | ICD-10-CM | POA: Diagnosis not present

## 2019-03-18 DIAGNOSIS — I952 Hypotension due to drugs: Secondary | ICD-10-CM | POA: Diagnosis not present

## 2019-04-04 ENCOUNTER — Other Ambulatory Visit: Payer: Self-pay

## 2019-04-04 ENCOUNTER — Encounter: Payer: Self-pay | Admitting: Cardiology

## 2019-04-04 ENCOUNTER — Ambulatory Visit (INDEPENDENT_AMBULATORY_CARE_PROVIDER_SITE_OTHER): Payer: Medicare Other | Admitting: Cardiology

## 2019-04-04 VITALS — BP 119/49 | HR 74 | Temp 96.6°F | Ht 73.0 in | Wt 297.2 lb

## 2019-04-04 DIAGNOSIS — I25708 Atherosclerosis of coronary artery bypass graft(s), unspecified, with other forms of angina pectoris: Secondary | ICD-10-CM

## 2019-04-04 DIAGNOSIS — R600 Localized edema: Secondary | ICD-10-CM

## 2019-04-04 NOTE — Progress Notes (Signed)
Patient referred by Pieter Partridge, PA for coronary artery disease  Subjective:   Blake Hurst, male    DOB: 1941-01-03, 78 y.o.   MRN: [id number]   Chief Complaint  Patient presents with   Coronary Artery Disease   Follow-up    84 week    HPI  78 year old Caucasian male with hypertension, type 2 diabetes mellitus, hyperlipidemia, morbid obesity, coronary artery disease CABG 1996, redo CABG 2012, with patent LIMA-LAD, right radial-OM, occluded SVG-RCA and SVG-diagonal, with severe native vessel disease.  Echocardiogram showed moderate LVH, EF 45%, grade 1 DD, no significant valvular abnormality. Carotid duplex showed mild b/l carotid stenosis. Bilateral ABI was normal at 1.0, although with biphasic and diminished waveform. AAA duplex could not be performed due to technical difficulty due to large body habitus.    Patient had one episode of the following.  He was at his mobile home and was getting ready to sit outside, as when he felt lightheaded and foggy.  He denies losing consciousness, or having any stroke/TIA symptoms.  He also denies any chest pain or shortness of breath.  He remembers eating cereal and then falling asleep, waking him back to baseline.  He does not check his sugars.  He continues to have leg swelling, but is concerned regarding increasing Lasix dose in the setting of his increasing creatinine..  He has not seen a nephrologist yet.   Initial office note: Patient lives by himself in [place] outside [place].  His physical activity is limited to doing some yard work outside his mobile home.  He reports that his physical activity is gradually decreased over the past 2-3 years.  He has occasional episodes of pressure across the chest with physical activity that gets better with rest, and use of tramadol.  His episodes occur 2-3 times a month.  He carries sublingual nitroglycerin, but has not had to use it.  He does endorse bilateral leg swelling.   He was recently recommended Lasix 40 mg daily, which previously was every other day.  He did have significant increase in his creatinine, and was asked to stop Lasix altogether.  Patient also complains of neuropathy in his feet and paresthesias.    Patient is originally from [place], underwent CABG in 1992, and redo CABG at [facility] in 2012.  Not able to find operative note for his second surgery. Patient was previously followed by Dr. Debara Pickett with [facility] heart care, last seen by him in 2017.  He last underwent coronary artery bypass graft angiography in December 2017 by Dr. Claiborne Billings.  In spring 2020, patient had one episode of sudden generalized weakness, and fall face down.  He did not lose consciousness completely.  It took him a few minutes to gather strength to get up.  This episode has not occurred since.  He still drives, exercising caution. Patient has 1 son and daughter-in-law who check on him recently.  Patient is compliant with his medical therapy.  He checks his blood sugar regularly.   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    Hypertension    Neuropathy    OA (osteoarthritis) of knee    Obesity (BMI 30-39.9)  Obesity hypoventilation syndrome ([place]) 01/24/2013   S/P CABG x 3 1992     Past Surgical History:  Procedure Laterality Date   CARDIAC CATHETERIZATION N/A 04/28/2016   Procedure: Left Heart Cath and Cors/Grafts Angiography;  Surgeon: Troy Sine, MD;  Location: [facility];  Service: Cardiovascular;  Laterality: N/A;   open heart surgeries     ruptured belly button     TONSILLECTOMY       Social History   Socioeconomic History   Marital status: Single    Spouse name: Not on file    Number of children: 4   Years of education: 12   Highest education level: Not on file  Occupational History   Occupation: retired    Fish farm manager: RETIRED    Comment: Mudlogger strain: Not on file   Food insecurity    Worry: Not on file    Inability: Not on Lexicographer needs    Medical: Not on file    Non-medical: Not on file  Tobacco Use   Smoking status: Former Smoker    Packs/day: 2.50    Years: 25.00    Pack years: 62.50    Types: Cigars    Quit date: 1980    Years since quitting: 40.9   Smokeless tobacco: Never Used   Tobacco comment: quit 35-40 years ago  Substance and Sexual Activity   Alcohol use: Yes    Comment: occas. drink   Drug use: No   Sexual activity: Not on file  Lifestyle   Physical activity    Days per week: Not on file    Minutes per session: Not on file   Stress: Not on file  Relationships   Social connections    Talks on phone: Not on file    Gets together: Not on file    Attends religious service: Not on file    Active member of club or organization: Not on file    Attends meetings of clubs or organizations: Not on file    Relationship status: Not on file   Intimate partner violence    Fear of current or ex partner: Not on file    Emotionally abused: Not on file    Physically abused: Not on file    Forced sexual activity: Not on file  Other Topics Concern   Not on file  Social History Narrative   epworth sleepiness scale scrore: 88     Family History  Problem Relation Age of Onset   Cancer - Prostate Father    Heart attack Father    Diabetes Sister    Hypertension Sister    Diabetes Brother    Cancer - Prostate Brother    Heart attack Brother    Diabetes Paternal Aunt      Current Outpatient Medications on File Prior to Visit  Medication Sig Dispense Refill   aspirin 81 MG tablet Take 81 mg by mouth daily.     atorvastatin (LIPITOR) 80 MG tablet  Take 80 mg by mouth daily at 6 PM. One tablet daily     clopidogrel (PLAVIX) 75 MG tablet Take 75 mg by mouth daily.      furosemide (LASIX) 40 MG tablet Take 1 tablet (40 mg total) by mouth daily. 1/2 pill every day 30 tablet 2   Insulin Detemir (LEVEMIR [place]) Inject 45 Units into the skin daily.      isosorbide mononitrate (IMDUR) 30 MG 24  hr tablet TAKE 1 TABLET BY MOUTH EVERY DAY 30 tablet 9   lisinopril-hydrochlorothiazide (PRINZIDE,ZESTORETIC) 20-12.5 MG per tablet Take 1 tablet by mouth daily.      metFORMIN (GLUCOPHAGE) 1000 MG tablet Take 1,000 mg by mouth 2 (two) times daily.      metoprolol (LOPRESSOR) 50 MG tablet Take 50 mg by mouth 2 (two) times daily.      NOVOTWIST 30G X 8 MM MISC One needle daily     omeprazole (PRILOSEC) 20 MG capsule Take 20 mg by mouth daily.      pregabalin (LYRICA) 75 MG capsule Take 75 mg by mouth at bedtime.      tamsulosin (FLOMAX) 0.4 MG CAPS capsule Take 1 capsule by mouth daily.     traMADol (ULTRAM) 50 MG tablet Take 1 tablet (50 mg total) by mouth every 6 (six) hours as needed. (Patient taking differently: Take 50 mg by mouth every 6 (six) hours as needed for moderate pain. ) 30 tablet 1   VICTOZA 18 MG/3ML SOPN Inject 1.8 mg into the skin daily. One needle daily     nitroGLYCERIN (NITROSTAT) 0.4 MG SL tablet Place 1 tablet (0.4 mg total) under the tongue every 5 (five) minutes as needed for chest pain. 25 tablet 3   No current facility-administered medications on file prior to visit.     Cardiovascular studies:  ABI 02/17/2019 (Preliminary result): Mildly abnormal PVR waveforms of the right ankle. Moderately abnormal spectral waveforms of the left ankle. Resting ABI 1.0. Moderately abnormal PVR waveforms of the left ankle. Resting ABI 1.0  Carotid duplex 02/17/2019 (Preliminary result): Mild stenosis in the left external carotid artery (<50%). Antegrade right vertebral artery flow. Antegrade left vertebral artery flow. Follow up in  one year is appropriate if clinically indicated.  Echocardiogram 02/17/2019: Left ventricle cavity is normal in size. Moderate concentric hypertrophy of the left ventricle. Abnormal septal wall motion due to post-operative coronary artery bypass graft. Mildly depressed LV systolic function with EF 45%. Doppler evidence of grade I (impaired) diastolic dysfunction, normal LAP.  Left atrial cavity is mildly dilated. Structurally normal trileaflet aortic valve with no regurgitation noted. Trace aortic valve stenosis. Trace MR, trace TR, trace PI. Normal right atrial pressure.   EKG 01/17/2019: Sinus rhythm 69 bpm. Right bundle branch block.  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  No significant change compared to previous EKG in 2017.   Coronary and bypass graft angiography 04/28/2016: LM: Normal LAD: 50% ostial, 80% mid LAD stenoses, 90% ostial septal perforator stneosis. LCx: 100% ostial occlusion RCA: Small caliber vessel. Severe RV marginal stenosis LIMA-LAD: Patent Radial artery-OM: Patent SVG-RCA: Occluded SVG-???: Prior stents. Occluded  Nuclear stress test 04/23/2016:  No diagnostic ST segment changes to indicate ischemia.  Small, moderate intensity, apical to basal inferior defect that exhibits partial reversibility apex and fixed toward the base. This is consistent with scar with peri-infarct ischemia towards the apex.  Moderate-sized, severe intensity, mid to basal inferolateral and anterolateral defect that is partially reversible throughout and consistent with moderate ischemic territory.  This is an intermediate risk study.  Nuclear stress EF: 49%.     Recent labs: 02/23/2019: Glucose 160.  BUN/creatinine 20/1.49.  EGFR 44.  Sodium 137, potassium 5.0.  01/05/2019: BUN/Cr 33/1.58. eGFR 41/ Na/K 139/4.3.   12/21/2018: Cr 1.32. eGFR 51. HbA1C 7.2%. Chol 214, TG 166, HDL 33, LDL 69.   Review of Systems  Constitution: Positive for malaise/fatigue. Negative for  decreased appetite, weight gain and weight loss.  HENT: Negative for  congestion.   Eyes: Negative for visual disturbance.  Cardiovascular: Positive for dyspnea on exertion (stable), leg swelling (Improved) and near-syncope (One episdoe in spring 2020). Negative for chest pain, palpitations and syncope.  Respiratory: Negative for cough.   Endocrine: Negative for cold intolerance.  Hematologic/Lymphatic: Does not bruise/bleed easily.  Skin: Negative for itching and rash.  Musculoskeletal: Negative for myalgias.  Gastrointestinal: Negative for abdominal pain, nausea and vomiting.  Genitourinary: Negative for dysuria.  Neurological: Positive for paresthesias. Negative for dizziness and weakness.  Psychiatric/Behavioral: The patient is not nervous/anxious.   All other systems reviewed and are negative.        Vitals:   04/04/19 1013  BP: (!) 119/49  Pulse: 74  Temp: (!) 96.6 F (35.9 C)  SpO2: 96%     Body mass index is 39.21 kg/m. Filed Weights   04/04/19 1013  Weight: 297 lb 3.2 oz (134.8 kg)     Objective:   Physical Exam  Constitutional: He is oriented to person, place, and time. He appears well-developed and well-nourished. No distress.  Moderately obese  HENT:  Head: Normocephalic and atraumatic.  Eyes: Pupils are equal, round, and reactive to light. Conjunctivae are normal.  Neck: No JVD present.  Cardiovascular: Normal rate and regular rhythm.  No murmur heard. Pulses:      Carotid pulses are on the left side with bruit.      Femoral pulses are 2+ on the right side and 2+ on the left side.      Popliteal pulses are 1+ on the right side and 1+ on the left side.       Dorsalis pedis pulses are 0 on the right side and 0 on the left side.       Posterior tibial pulses are 0 on the right side and 0 on the left side.  Delayed capillary refill, without any ulcers, wounds, gangrene.  Pulmonary/Chest: Effort normal and breath sounds normal. He has no wheezes. He has no  rales.  Abdominal: Soft. Bowel sounds are normal. There is no rebound.  Musculoskeletal:        General: Edema (2+ b/l) present.  Lymphadenopathy:    He has no cervical adenopathy.  Neurological: He is alert and oriented to person, place, and time. No cranial nerve deficit.  Skin: Skin is warm and dry.  Psychiatric: He has a normal mood and affect.  Nursing note and vitals reviewed.         Assessment & Recommendations:   78 year old Caucasian male with hypertension, type 2 diabetes mellitus, hyperlipidemia, morbid obesity, coronary artery disease CABG 1996, redo CABG 2012, with patent LIMA-LAD, right radial-OM, occluded SVG-RCA and SVG-diagonal, with severe native vessel disease, here to establish cardiac care for management of coronary artery disease.  CAD with stable angina: H/o CABG and redo CABG, 2/4 grafts patent, severe native vessel CAD on cath in 2017. Angina symptoms fairly well controlled with current medical therapy.  Continue aspirin, Plavix, statin, metoprolol, Imdur. Continue lisinopril-HCTZ for now.  Exertional dyspnea, leg swelling: Stable dyspnea.  Variable leg swelling in the recent past. Echocardiogram shows grade 1 diastolic dysfunction, which could partially explain his leg swelling. Continue Lasix 1/2 tab of 40 mg daily for now. Consider seeing nehrology.   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  Decreased peripheral pulses: Normal ABI with abnormal waveform.  I suspect he has calcified vessels due to diabetes.  Continue medical management.  No indication for invasive work-up at this time.  DM:   I suspect  his episode of mental confusion at home occurred due to hypoglycemia.  I encouraged him to discuss this with his PCP at the upcoming visit and consider any adjustments to his diabetes management.  Follow-up in 6 weeks.  Nigel Mormon, MD [facility] Pager: [phone] Office: [phone] If no answer Cell [phone]

## 2019-04-06 DIAGNOSIS — E782 Mixed hyperlipidemia: Secondary | ICD-10-CM | POA: Diagnosis not present

## 2019-04-06 DIAGNOSIS — Z794 Long term (current) use of insulin: Secondary | ICD-10-CM | POA: Diagnosis not present

## 2019-04-06 DIAGNOSIS — E1140 Type 2 diabetes mellitus with diabetic neuropathy, unspecified: Secondary | ICD-10-CM | POA: Diagnosis not present

## 2019-04-06 DIAGNOSIS — I10 Essential (primary) hypertension: Secondary | ICD-10-CM | POA: Diagnosis not present

## 2019-05-20 DIAGNOSIS — L0390 Cellulitis, unspecified: Secondary | ICD-10-CM

## 2019-05-20 HISTORY — DX: Cellulitis, unspecified: L03.90

## 2019-07-18 ENCOUNTER — Ambulatory Visit: Payer: Medicare Other | Attending: Internal Medicine

## 2019-07-18 DIAGNOSIS — Z23 Encounter for immunization: Secondary | ICD-10-CM | POA: Insufficient documentation

## 2019-07-18 NOTE — Progress Notes (Signed)
   Covid-19 Vaccination Clinic  Name:  Blake Hurst    MRN: [id number] DOB: 1940-08-14  07/18/2019  Mr. Bouza was observed post Covid-19 immunization for 15 minutes without incidence. He was provided with Vaccine Information Sheet and instruction to access the V-Safe system.   Mr. Altschuler was instructed to call 911 with any severe reactions post vaccine: Marland Kitchen Difficulty breathing  . Swelling of your face and throat  . A fast heartbeat  . A bad rash all over your body  . Dizziness and weakness    Immunizations Administered    Name Date Dose VIS Date Route   Pfizer COVID-19 Vaccine 07/18/2019  9:08 AM 0.3 mL 04/29/2019 Intramuscular   Manufacturer: [place]   Lot: [id number]   [place]: [id number]

## 2019-08-16 ENCOUNTER — Ambulatory Visit: Payer: Medicare Other | Attending: Internal Medicine

## 2019-08-16 DIAGNOSIS — Z23 Encounter for immunization: Secondary | ICD-10-CM

## 2019-08-16 NOTE — Progress Notes (Signed)
   Covid-19 Vaccination Clinic  Name:  Dontee Kilkenny    MRN: [id number] DOB: 1940/09/12  08/16/2019  Mr. Pitstick was observed post Covid-19 immunization for 15 minutes without incident. He was provided with Vaccine Information Sheet and instruction to access the V-Safe system.   Mr. Baile was instructed to call 911 with any severe reactions post vaccine: Marland Kitchen Difficulty breathing  . Swelling of face and throat  . A fast heartbeat  . A bad rash all over body  . Dizziness and weakness   Immunizations Administered    Name Date Dose VIS Date Route   Pfizer COVID-19 Vaccine 08/16/2019  8:54 AM 0.3 mL 04/29/2019 Intramuscular   Manufacturer: [place]   Lot: [id number]   [place]: [id number]

## 2019-10-01 NOTE — Progress Notes (Signed)
Follow up visit  Subjective:   Blake Hurst, male    DOB: January 03, 1941, 79 y.o.   MRN: [id number]  HPI   Chief Complaint  Patient presents with  . Leg Swelling    6 months  . Follow-up    pt c/o aches    79 y.o. Caucasian male with hypertension, type 2 diabetes mellitus, hyperlipidemia, morbid obesity, coronary artery disease CABG 1996, redo CABG 2012, with patent LIMA-LAD, right radial-OM, occluded SVG-RCA and SVG-diagonal, with severe native vessel disease.  Patient continues to have bilateral leg edema. He is also concerned about his weight gain. He has not seen a nephrologist yet.   He is currently taking lasix 40 mg daily for past month, increased from 20 mg a day. He has not noticed any significant improvement in le edema.   He is concerned about his right hip pain.Pain is worse with standing and walking, better with sitting.   Current Outpatient Medications on File Prior to Visit  Medication Sig Dispense Refill  . aspirin 81 MG tablet Take 81 mg by mouth daily.    Marland Kitchen atorvastatin (LIPITOR) 80 MG tablet Take 80 mg by mouth daily at 6 PM. One tablet daily    . clopidogrel (PLAVIX) 75 MG tablet Take 75 mg by mouth daily.     . furosemide (LASIX) 40 MG tablet Take 1 tablet (40 mg total) by mouth daily. 1/2 pill every day 30 tablet 2  . Insulin Detemir (LEVEMIR [place]) Inject 45 Units into the skin daily.     . isosorbide mononitrate (IMDUR) 30 MG 24 hr tablet TAKE 1 TABLET BY MOUTH EVERY DAY 30 tablet 9  . lisinopril-hydrochlorothiazide (PRINZIDE,ZESTORETIC) 20-12.5 MG per tablet Take 1 tablet by mouth daily.     . metFORMIN (GLUCOPHAGE) 1000 MG tablet Take 1,000 mg by mouth 2 (two) times daily.     . metoprolol (LOPRESSOR) 50 MG tablet Take 50 mg by mouth 2 (two) times daily.     . nitroGLYCERIN (NITROSTAT) 0.4 MG SL tablet Place 1 tablet (0.4 mg total) under the tongue every 5 (five) minutes as needed for chest pain. 25 tablet 3  . NOVOTWIST 30G X 8 MM MISC One needle daily     . omeprazole (PRILOSEC) 20 MG capsule Take 20 mg by mouth daily.     . pregabalin (LYRICA) 75 MG capsule Take 75 mg by mouth at bedtime.     . traMADol (ULTRAM) 50 MG tablet Take 1 tablet (50 mg total) by mouth every 6 (six) hours as needed. (Patient taking differently: Take 50 mg by mouth every 6 (six) hours as needed for moderate pain. ) 30 tablet 1  . VICTOZA 18 MG/3ML SOPN Inject 1.8 mg into the skin daily. One needle daily     No current facility-administered medications on file prior to visit.    Cardiovascular & other pertient studies:  EKG 10/03/2019: Sinus rhythm 73 bpm Right bundle branch block Left anterior fascicular block Low voltage precordial leads  ABI 02/17/2019:  This exam reveals normal perfusion of bilateral lower extremity (ABI  1.00). Abnormal waveform at the ankle suggests small vessel disease.  Clinical correlation recommended.  Carotid duplex 02/17/2019 (Preliminary result): Mild stenosis in the left external carotid artery (<50%). Antegrade right vertebral artery flow. Antegrade left vertebral artery flow. Follow up in one year is appropriate if clinically indicated.  Echocardiogram 02/17/2019: Left ventricle cavity is normal in size. Moderate concentric hypertrophy of the left ventricle. Abnormal septal wall motion due to  post-operative coronary artery bypass graft. Mildly depressed LV systolic function with EF 45%. Doppler evidence of grade I (impaired) diastolic dysfunction, normal LAP.  Left atrial cavity is mildly dilated. Structurally normal trileaflet aortic valve with no regurgitation noted. Trace aortic valve stenosis. Trace MR, trace TR, trace PI. Normal right atrial pressure.   Coronary and bypass graft angiography 04/28/2016: LM: Normal LAD: 50% ostial, 80% mid LAD stenoses, 90% ostial septal perforator stneosis. LCx: 100% ostial occlusion RCA: Small caliber vessel. Severe RV marginal stenosis LIMA-LAD: Patent Radial artery-OM:  Patent SVG-RCA: Occluded SVG-???: Prior stents. Occluded  Nuclear stress test 04/23/2016: No diagnostic ST segment changes to indicate ischemia. Small, moderate intensity, apical to basal inferior defect that exhibits partial reversibility apex and fixed toward the base. This is consistent with scar with peri-infarct ischemia towards the apex. Moderate-sized, severe intensity, mid to basal inferolateral and anterolateral defect that is partially reversible throughout and consistent with moderate ischemic territory. This is an intermediate risk study. Nuclear stress EF: 49%.    Recent labs: 02/23/2019: Glucose 160.  BUN/creatinine 20/1.49.  EGFR 44.  Sodium 137, potassium 5.0.  01/05/2019: BUN/Cr 33/1.58. eGFR 41/ Na/K 139/4.3.   12/21/2018: Cr 1.32. eGFR 51. HbA1C 7.2%. Chol 214, TG 166, HDL 33, LDL 69.    Review of Systems  Constitution: Positive for malaise/fatigue.  Cardiovascular: Positive for dyspnea on exertion (stable) and leg swelling (improved). Negative for chest pain, near-syncope, palpitations and syncope.  Neurological: Positive for paresthesias.         Vitals:   10/03/19 1007  BP: 128/61  Pulse: 73  SpO2: 98%     Body mass index is 39.45 kg/m. Filed Weights   10/03/19 1007  Weight: 299 lb (135.6 kg)     Objective:   Physical Exam  Constitutional: No distress.  Moderately obese  Neck: No JVD present.  Cardiovascular: Normal rate, regular rhythm, normal heart sounds and intact distal pulses.  No murmur heard. Pulses:      Carotid pulses are on the left side with bruit.      Femoral pulses are 2+ on the right side and 2+ on the left side.      Popliteal pulses are 1+ on the right side and 1+ on the left side.       Dorsalis pedis pulses are 0 on the right side and 0 on the left side.       Posterior tibial pulses are 0 on the right side and 0 on the left side.  Delayed capillary refill, without any ulcers, wounds, gangrene.    Pulmonary/Chest: Effort normal and breath sounds normal. He has no wheezes. He has no rales.  Musculoskeletal:     Comments: Edema (2+ b/l) present.   Nursing note and vitals reviewed.         Assessment & Recommendations:   79 year old Caucasian male with hypertension, type 2 diabetes mellitus, hyperlipidemia, morbid obesity, coronary artery disease CABG 1996, redo CABG 2012, with patent LIMA-LAD, right radial-OM, occluded SVG-RCA and SVG-diagonal, with severe native vessel disease, here to establish cardiac care for management of coronary artery disease.   CAD with stable angina: H/o CABG and redo CABG, 2/4 grafts patent, severe native vessel CAD on cath in 2017. Angina symptoms fairly well controlled with current medical therapy.  Continue aspirin, Plavix, statin, metoprolol, Imdur. Continue lisinopril-HCTZ for now.  Exertional dyspnea, leg swelling: Likely HFpEF in the setting of obesity, uncontrolled type 2 DM, CKD 3.  Switch lasix 40 mg daily to torsemide  20 mg bid, given better bioavailability.  Recommended remote patient monitoring for daily weight checks.  Dicussed diet and lifestyle modifications, low salt and low carbohydrate diet.  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 Consider nephrology evaluation.  Decreased peripheral pulses: Normal ABI with abnormal waveform.  I suspect he has calcified vessels due to diabetes. He has 2+ b/l femoral pulse. I do not think he has severe aortoiliac disease. Thus, I think his left hip pain is not due to vascular claudication. Consider musucloskeletal etiology.   DM:  Managed by PCP. Bariatric surgery could be an option given his uncontrolled DM, BMI>35. However, poor social support could be an hindrance.   Follow-up in 4 weeks.  Time spent: 45 min   [place], MD [facility] Pager: [phone] Office: [phone]

## 2019-10-03 ENCOUNTER — Ambulatory Visit: Payer: Medicare Other | Admitting: Cardiology

## 2019-10-03 ENCOUNTER — Other Ambulatory Visit: Payer: Self-pay

## 2019-10-03 ENCOUNTER — Encounter: Payer: Self-pay | Admitting: Cardiology

## 2019-10-03 VITALS — BP 128/61 | HR 73 | Ht 73.0 in | Wt 299.0 lb

## 2019-10-03 DIAGNOSIS — R0989 Other specified symptoms and signs involving the circulatory and respiratory systems: Secondary | ICD-10-CM

## 2019-10-03 DIAGNOSIS — R600 Localized edema: Secondary | ICD-10-CM | POA: Diagnosis not present

## 2019-10-03 DIAGNOSIS — I25708 Atherosclerosis of coronary artery bypass graft(s), unspecified, with other forms of angina pectoris: Secondary | ICD-10-CM | POA: Diagnosis not present

## 2019-10-03 DIAGNOSIS — I5032 Chronic diastolic (congestive) heart failure: Secondary | ICD-10-CM | POA: Diagnosis not present

## 2019-10-03 MED ORDER — TORSEMIDE 20 MG PO TABS: 20.0000 mg | ORAL_TABLET | Freq: Two times a day (BID) | ORAL | 3 refills | Status: DC

## 2019-10-03 NOTE — Patient Instructions (Signed)

## 2019-10-04 DIAGNOSIS — E785 Hyperlipidemia, unspecified: Secondary | ICD-10-CM | POA: Diagnosis not present

## 2019-10-04 DIAGNOSIS — N1831 Chronic kidney disease, stage 3a: Secondary | ICD-10-CM | POA: Diagnosis not present

## 2019-10-04 DIAGNOSIS — I25119 Atherosclerotic heart disease of native coronary artery with unspecified angina pectoris: Secondary | ICD-10-CM | POA: Diagnosis not present

## 2019-10-04 DIAGNOSIS — N401 Enlarged prostate with lower urinary tract symptoms: Secondary | ICD-10-CM | POA: Diagnosis not present

## 2019-10-04 DIAGNOSIS — E1140 Type 2 diabetes mellitus with diabetic neuropathy, unspecified: Secondary | ICD-10-CM | POA: Diagnosis not present

## 2019-10-04 DIAGNOSIS — I129 Hypertensive chronic kidney disease with stage 1 through stage 4 chronic kidney disease, or unspecified chronic kidney disease: Secondary | ICD-10-CM | POA: Diagnosis not present

## 2019-10-04 DIAGNOSIS — M25551 Pain in right hip: Secondary | ICD-10-CM | POA: Diagnosis not present

## 2019-10-04 DIAGNOSIS — R2243 Localized swelling, mass and lump, lower limb, bilateral: Secondary | ICD-10-CM | POA: Diagnosis not present

## 2019-10-18 DIAGNOSIS — N1831 Chronic kidney disease, stage 3a: Secondary | ICD-10-CM | POA: Diagnosis not present

## 2019-10-18 HISTORY — PX: FL HIP INJECTION (ARMC HX): HXRAD1300

## 2019-10-24 ENCOUNTER — Telehealth: Payer: Self-pay | Admitting: Cardiology

## 2019-10-24 NOTE — Telephone Encounter (Signed)
Call documentation:  Spoke with Adrienne Mocha, PA. Cr up to 2.1. Concern for hypotension. Agree with holding lisinopril and probably torsemide and HCTZ as well. Needs to see nephrology soon.  Thanks MJP

## 2019-10-26 DIAGNOSIS — R600 Localized edema: Secondary | ICD-10-CM | POA: Diagnosis not present

## 2019-11-07 DIAGNOSIS — M8588 Other specified disorders of bone density and structure, other site: Secondary | ICD-10-CM | POA: Diagnosis not present

## 2019-11-07 DIAGNOSIS — M1611 Unilateral primary osteoarthritis, right hip: Secondary | ICD-10-CM | POA: Diagnosis not present

## 2019-11-07 DIAGNOSIS — M25751 Osteophyte, right hip: Secondary | ICD-10-CM | POA: Diagnosis not present

## 2019-11-11 ENCOUNTER — Telehealth: Payer: Self-pay | Admitting: Pharmacist

## 2019-11-11 NOTE — Telephone Encounter (Signed)
Med list reviewed and updated. Pt recently restarted torsemide 20 mg daily and was instructed to restart lisinopril, but pt hasnt started yet. Home BP readings have been between 120s-140s at home. Leg swelling and pain remain persistent. Complains on severe cold sweats yesterday after NSAID injection to the right hip. No recurrence since. No SOB or dyspnea.

## 2019-11-14 ENCOUNTER — Encounter: Payer: Self-pay | Admitting: Cardiology

## 2019-11-14 ENCOUNTER — Other Ambulatory Visit: Payer: Self-pay

## 2019-11-14 ENCOUNTER — Ambulatory Visit: Payer: Medicare Other | Admitting: Cardiology

## 2019-11-14 VITALS — BP 159/76 | HR 62 | Resp 17 | Ht 73.0 in | Wt 290.0 lb

## 2019-11-14 DIAGNOSIS — I10 Essential (primary) hypertension: Secondary | ICD-10-CM

## 2019-11-14 DIAGNOSIS — I25708 Atherosclerosis of coronary artery bypass graft(s), unspecified, with other forms of angina pectoris: Secondary | ICD-10-CM

## 2019-11-14 DIAGNOSIS — I5032 Chronic diastolic (congestive) heart failure: Secondary | ICD-10-CM

## 2019-11-14 DIAGNOSIS — R600 Localized edema: Secondary | ICD-10-CM

## 2019-11-14 DIAGNOSIS — N1832 Chronic kidney disease, stage 3b: Secondary | ICD-10-CM | POA: Insufficient documentation

## 2019-11-14 MED ORDER — AMLODIPINE BESYLATE 5 MG PO TABS: 5.0000 mg | ORAL_TABLET | Freq: Every day | ORAL | 3 refills | Status: DC

## 2019-11-14 NOTE — Progress Notes (Signed)
Follow up visit  Subjective:   Blake Hurst, male    DOB: 10-Jun-1940, 79 y.o.   MRN: [id number]  HPI   Chief Complaint  Patient presents with  . Leg Swelling  . Coronary Artery Disease  . Follow-up    1 month    79 y.o. Caucasian male with hypertension, type 2 diabetes mellitus, hyperlipidemia, morbid obesity, coronary artery disease CABG 1996, redo CABG 2012, with patent LIMA-LAD, right radial-OM, occluded SVG-RCA and SVG-diagonal, with severe native vessel disease.  I had a telephone conversation with patient's PCP Lennette Bihari L. Laurance Flatten, [place] on 10/26/2019 regarding patient's increasing creatinine and hypertension.  We have mutually decided to stop his lisinopril and hydrochlorothiazide.  Since then, he has had remarkable improvement in his creatinine, down from 2.09 to 1.17.  Patient feels "great" and has lost 3-1/2 pounds..  He recently had Toradol injection for his hip pain.  Since then, he has noticed increase in his blood pressures.   Current Outpatient Medications on File Prior to Visit  Medication Sig Dispense Refill  . atorvastatin (LIPITOR) 80 MG tablet Take 80 mg by mouth daily at 6 PM. One tablet daily    . clopidogrel (PLAVIX) 75 MG tablet Take 75 mg by mouth daily.     Marland Kitchen glucose blood (ONETOUCH VERIO) test strip USE TO TEST BLOOD SUGAR 2-3 TIMES DAILY    . Insulin Detemir (LEVEMIR [place]) Inject 66 Units into the skin daily.     . isosorbide mononitrate (IMDUR) 30 MG 24 hr tablet TAKE 1 TABLET BY MOUTH EVERY DAY 30 tablet 9  . metFORMIN (GLUCOPHAGE) 1000 MG tablet Take 1,000 mg by mouth 2 (two) times daily.     . metoprolol (LOPRESSOR) 50 MG tablet Take 50 mg by mouth 2 (two) times daily.     . nitroGLYCERIN (NITROSTAT) 0.4 MG SL tablet Place 1 tablet (0.4 mg total) under the tongue every 5 (five) minutes as needed for chest pain. 25 tablet 3  . NOVOTWIST 30G X 8 MM MISC One needle daily    . omeprazole (PRILOSEC) 20 MG capsule Take 20 mg by mouth daily.     . pregabalin  (LYRICA) 75 MG capsule Take 75 mg by mouth at bedtime.     . tamsulosin (FLOMAX) 0.4 MG CAPS capsule Take 0.4 mg by mouth daily.    Marland Kitchen torsemide (DEMADEX) 20 MG tablet Take 1 tablet (20 mg total) by mouth 2 (two) times daily. (Patient taking differently: Take 20 mg by mouth daily. ) 60 tablet 3  . traMADol (ULTRAM) 50 MG tablet Take 1 tablet (50 mg total) by mouth every 6 (six) hours as needed. (Patient taking differently: Take 50 mg by mouth every 6 (six) hours as needed for moderate pain. ) 30 tablet 1  . VICTOZA 18 MG/3ML SOPN Inject 1.8 mg into the skin daily. One needle daily     No current facility-administered medications on file prior to visit.    Cardiovascular & other pertient studies:  EKG 10/03/2019: Sinus rhythm 73 bpm Right bundle branch block Left anterior fascicular block Low voltage precordial leads  ABI 02/17/2019:  This exam reveals normal perfusion of bilateral lower extremity (ABI  1.00). Abnormal waveform at the ankle suggests small vessel disease.  Clinical correlation recommended.  Carotid duplex 02/17/2019 (Preliminary result): Mild stenosis in the left external carotid artery (<50%). Antegrade right vertebral artery flow. Antegrade left vertebral artery flow. Follow up in one year is appropriate if clinically indicated.  Echocardiogram 02/17/2019: Left ventricle  cavity is normal in size. Moderate concentric hypertrophy of the left ventricle. Abnormal septal wall motion due to post-operative coronary artery bypass graft. Mildly depressed LV systolic function with EF 45%. Doppler evidence of grade I (impaired) diastolic dysfunction, normal LAP.  Left atrial cavity is mildly dilated. Structurally normal trileaflet aortic valve with no regurgitation noted. Trace aortic valve stenosis. Trace MR, trace TR, trace PI. Normal right atrial pressure.   Coronary and bypass graft angiography 04/28/2016: LM: Normal LAD: 50% ostial, 80% mid LAD stenoses, 90% ostial septal  perforator stneosis. LCx: 100% ostial occlusion RCA: Small caliber vessel. Severe RV marginal stenosis LIMA-LAD: Patent Radial artery-OM: Patent SVG-RCA: Occluded SVG-???: Prior stents. Occluded  Nuclear stress test 04/23/2016: No diagnostic ST segment changes to indicate ischemia. Small, moderate intensity, apical to basal inferior defect that exhibits partial reversibility apex and fixed toward the base. This is consistent with scar with peri-infarct ischemia towards the apex. Moderate-sized, severe intensity, mid to basal inferolateral and anterolateral defect that is partially reversible throughout and consistent with moderate ischemic territory. This is an intermediate risk study. Nuclear stress EF: 49%.    Recent labs: 11/01/2019: Glucose 105, BUN/Cr 18/1.17. EGFR 59. Na/K 143/4.1.   10/18/2019: Glucose 142, BUN/Cr 29/2.09. EGFR 29. Na/K 139/4.5.   10/04/2019: Glucose 125, BUN/Cr 30/1.61. EGFR 40. Na/K 139/5.2  HbA1C 7.7%  02/23/2019: Glucose 160.  BUN/creatinine 20/1.49.  EGFR 44.  Sodium 137, potassium 5.0.  01/05/2019: BUN/Cr 33/1.58. eGFR 41/ Na/K 139/4.3.   12/21/2018: Cr 1.32. eGFR 51. HbA1C 7.2%. Chol 214, TG 166, HDL 33, LDL 69.    Review of Systems  Constitutional: Negative for malaise/fatigue.  Cardiovascular: Positive for leg swelling (improved). Negative for chest pain, dyspnea on exertion (stable), near-syncope, palpitations and syncope.  Neurological: Positive for paresthesias.         Vitals:   11/14/19 1015  BP: (!) 159/76  Pulse: 62  Resp: 17  SpO2: 97%     Body mass index is 38.26 kg/m. Filed Weights   11/14/19 1015  Weight: 290 lb (131.5 kg)     Objective:   Physical Exam Vitals and nursing note reviewed.  Constitutional:      General: He is not in acute distress.    Comments: Moderately obese  Neck:     Vascular: No JVD.  Cardiovascular:     Rate and Rhythm: Normal rate and regular rhythm.     Pulses: Intact distal  pulses.          Carotid pulses are on the left side with bruit.      Femoral pulses are 2+ on the right side and 2+ on the left side.      Popliteal pulses are 1+ on the right side and 1+ on the left side.       Dorsalis pedis pulses are 0 on the right side and 0 on the left side.       Posterior tibial pulses are 0 on the right side and 0 on the left side.     Heart sounds: Normal heart sounds. No murmur heard.      Comments: Delayed capillary refill, without any ulcers, wounds, gangrene.  Pulmonary:     Effort: Pulmonary effort is normal.     Breath sounds: Normal breath sounds. No wheezing or rales.  Musculoskeletal:     Right lower leg: No edema.     Left lower leg: Edema (Trace) present.     Comments: Edema (2+ b/l) present.  Assessment & Recommendations:   79 year old Caucasian male with hypertension, type 2 diabetes mellitus, hyperlipidemia, morbid obesity, coronary artery disease CABG 1996, redo CABG 2012, with patent LIMA-LAD, right radial-OM, occluded SVG-RCA and SVG-diagonal, with severe native vessel disease, here to establish cardiac care for management of coronary artery disease.   CAD with stable angina: H/o CABG and redo CABG, 2/4 grafts patent, severe native vessel CAD on cath in 2017. Angina symptoms fairly well controlled with current medical therapy.  Continue aspirin, Plavix, statin, metoprolol, Imdur.  Hypertension: Uncontrolled.  Given increase in creatinine while on lisinopril, and significant improvement after stopping it, suggest possibility of renal artery stenosis.  Patient is morbidly obese with predominantly central obesity.  I do not think renal artery ultrasound would yield good images.  Empirically, I would recommend avoiding ACE/ARB suspecting renal artery stenosis. Added amlodipine 5 mg daily. Recommend remote patient monitoring  Exertional dyspnea, leg swelling: Likely HFpEF in the setting of obesity, uncontrolled type 2 DM, CKD 3.   Symptoms fairly well controlled with torsemide 20 mg daily.  CKD: GFR now improved to 59.  He does have appointment scheduled to see nephrology.  Decreased peripheral pulses: Normal ABI with abnormal waveform.  I suspect he has calcified vessels due to diabetes. He has 2+ b/l femoral pulse. I do not think he has severe aortoiliac disease. Thus, I felt  his left hip pain is not due to vascular claudication, and more likely due to arthritis.  He has had improvement in pain after Toradol injection.  DM:  Managed by PCP. Bariatric surgery could be an option given his uncontrolled DM, BMI>35. However, poor social support could be an hindrance.   Follow-up in 3 months   Sheronda Parran Esther Hardy, MD [facility] Pager: [phone] Office: [phone]

## 2019-11-16 DIAGNOSIS — I5033 Acute on chronic diastolic (congestive) heart failure: Secondary | ICD-10-CM | POA: Diagnosis not present

## 2019-11-28 DIAGNOSIS — N1832 Chronic kidney disease, stage 3b: Secondary | ICD-10-CM | POA: Diagnosis not present

## 2019-11-30 ENCOUNTER — Other Ambulatory Visit: Payer: Self-pay | Admitting: Nephrology

## 2019-11-30 ENCOUNTER — Other Ambulatory Visit (HOSPITAL_COMMUNITY): Payer: Self-pay | Admitting: Nephrology

## 2019-11-30 DIAGNOSIS — N1831 Chronic kidney disease, stage 3a: Secondary | ICD-10-CM

## 2019-12-07 ENCOUNTER — Ambulatory Visit (HOSPITAL_COMMUNITY)
Admission: RE | Admit: 2019-12-07 | Discharge: 2019-12-07 | Disposition: A | Discharge status: Home / Self Care | Payer: Medicare Other | Source: Ambulatory Visit | Attending: Nephrology | Admitting: Nephrology

## 2019-12-07 ENCOUNTER — Other Ambulatory Visit: Payer: Self-pay

## 2019-12-07 DIAGNOSIS — N1831 Chronic kidney disease, stage 3a: Secondary | ICD-10-CM | POA: Insufficient documentation

## 2019-12-07 DIAGNOSIS — N281 Cyst of kidney, acquired: Secondary | ICD-10-CM | POA: Diagnosis not present

## 2019-12-07 DIAGNOSIS — N323 Diverticulum of bladder: Secondary | ICD-10-CM | POA: Diagnosis not present

## 2019-12-07 DIAGNOSIS — N183 Chronic kidney disease, stage 3 unspecified: Secondary | ICD-10-CM | POA: Diagnosis not present

## 2019-12-07 DIAGNOSIS — N2889 Other specified disorders of kidney and ureter: Secondary | ICD-10-CM | POA: Diagnosis not present

## 2019-12-15 ENCOUNTER — Other Ambulatory Visit (HOSPITAL_COMMUNITY): Payer: Self-pay | Admitting: Nephrology

## 2019-12-15 ENCOUNTER — Other Ambulatory Visit: Payer: Self-pay | Admitting: Nephrology

## 2019-12-17 DIAGNOSIS — I5033 Acute on chronic diastolic (congestive) heart failure: Secondary | ICD-10-CM | POA: Diagnosis not present

## 2019-12-19 ENCOUNTER — Other Ambulatory Visit (HOSPITAL_COMMUNITY): Payer: Self-pay | Admitting: Nephrology

## 2019-12-19 ENCOUNTER — Other Ambulatory Visit: Payer: Self-pay | Admitting: Nephrology

## 2019-12-19 DIAGNOSIS — N281 Cyst of kidney, acquired: Secondary | ICD-10-CM

## 2019-12-19 DIAGNOSIS — N1832 Chronic kidney disease, stage 3b: Secondary | ICD-10-CM

## 2019-12-31 ENCOUNTER — Other Ambulatory Visit: Payer: Self-pay | Admitting: Cardiology

## 2019-12-31 DIAGNOSIS — Z23 Encounter for immunization: Secondary | ICD-10-CM | POA: Diagnosis not present

## 2019-12-31 DIAGNOSIS — I10 Essential (primary) hypertension: Secondary | ICD-10-CM

## 2020-01-11 ENCOUNTER — Ambulatory Visit (HOSPITAL_COMMUNITY)
Admission: RE | Admit: 2020-01-11 | Discharge: 2020-01-11 | Disposition: A | Discharge status: Home / Self Care | Payer: Medicare Other | Source: Ambulatory Visit | Attending: Nephrology | Admitting: Nephrology

## 2020-01-11 ENCOUNTER — Other Ambulatory Visit: Payer: Self-pay

## 2020-01-11 DIAGNOSIS — N1832 Chronic kidney disease, stage 3b: Secondary | ICD-10-CM

## 2020-01-11 DIAGNOSIS — N281 Cyst of kidney, acquired: Secondary | ICD-10-CM | POA: Diagnosis not present

## 2020-01-11 DIAGNOSIS — I700 Atherosclerosis of aorta: Secondary | ICD-10-CM | POA: Diagnosis not present

## 2020-01-11 DIAGNOSIS — N2889 Other specified disorders of kidney and ureter: Secondary | ICD-10-CM | POA: Diagnosis not present

## 2020-01-11 LAB — POCT I-STAT CREATININE: Creatinine, Ser: 1.3 mg/dL — ABNORMAL HIGH (ref 0.61–1.24)

## 2020-01-11 MED ORDER — IOHEXOL 300 MG/ML  SOLN
100.0000 mL | Freq: Once | INTRAMUSCULAR | Status: AC | PRN
  Administered 2020-01-11: 100 mL via INTRAVENOUS

## 2020-01-17 DIAGNOSIS — I5033 Acute on chronic diastolic (congestive) heart failure: Secondary | ICD-10-CM | POA: Diagnosis not present

## 2020-01-25 DIAGNOSIS — Z23 Encounter for immunization: Secondary | ICD-10-CM | POA: Diagnosis not present

## 2020-01-27 ENCOUNTER — Other Ambulatory Visit: Payer: Self-pay

## 2020-01-27 ENCOUNTER — Ambulatory Visit (INDEPENDENT_AMBULATORY_CARE_PROVIDER_SITE_OTHER): Payer: Medicare Other | Admitting: Urology

## 2020-01-27 ENCOUNTER — Encounter: Payer: Self-pay | Admitting: Urology

## 2020-01-27 VITALS — BP 137/70 | HR 80 | Temp 97.4°F | Ht 73.0 in | Wt 290.0 lb

## 2020-01-27 DIAGNOSIS — N2889 Other specified disorders of kidney and ureter: Secondary | ICD-10-CM

## 2020-01-27 DIAGNOSIS — I25708 Atherosclerosis of coronary artery bypass graft(s), unspecified, with other forms of angina pectoris: Secondary | ICD-10-CM

## 2020-01-27 NOTE — Progress Notes (Signed)
Urological Symptom Review  Patient is experiencing the following symptoms: Frequent urination Hard to postpone urination Leakage of urine Erection problems (male only)   Review of Systems  Gastrointestinal (upper)  : Negative for upper GI symptoms  Gastrointestinal (lower) : Negative for lower GI symptoms  Constitutional : Fatigue  Skin: Negative for skin symptoms  Eyes: Negative for eye symptoms  Ear/Nose/Throat : Negative for Ear/Nose/Throat symptoms  Hematologic/Lymphatic: Negative for Hematologic/Lymphatic symptoms  Cardiovascular : Leg swelling  Respiratory : Negative for respiratory symptoms  Endocrine: Negative for endocrine symptoms  Musculoskeletal: Back pain  Neurological: Negative for neurological symptoms  Psychologic: Negative for psychiatric symptoms

## 2020-01-27 NOTE — Patient Instructions (Signed)
Renal Mass  A renal mass is a growth in the kidney. A renal mass may be found while performing an MRI, CT scan, or ultrasound for other problems of the abdomen. Certain types of cancers, infections, or injuries can cause a renal mass. A renal mass that is cancerous (malignant) may grow or spread quickly. Others are harmless (benign). What are common types of renal masses? Renal masses include:  Tumors. These may be cancerous (malignant) or noncancerous (benign). ? The most common type of kidney cancer is renal cell carcinoma. ? The most common benign tumors of the kidney include renal adenomas, oncocytomas, and angiomyolipoma (AML).  Cysts. These are fluid-filled sacs that form on or in the kidney. ? It is not always known what causes a cyst to develop in or on the kidney. ? Most kidney cysts do not cause symptoms and do not need to be treated. What type of testing might I need? Your health care provider may recommend that you have tests to diagnose the cause of your renal mass. The following tests may be done if a renal mass is found:  Physical exam.  Blood tests.  Urine tests.  Imaging tests, such as ultrasound, CT scan, or MRI.  Biopsy. This is a small sample that is removed from the renal mass and tested in a lab. The exact tests and how often they are done will depend on:  The size and appearance of the renal mass.  Risk factors or medical conditions that increase your risk for problems.  Any symptoms associated with the renal mass, or concerns that you have about it. Tests and physical exams may be done once, or they may be done regularly for a period of time. Tests and exams that are done regularly will help monitor whether the mass is growing and beginning to cause problems. What are common treatments for renal masses? Treatment is not always needed for this condition. Your health care provider may recommend careful monitoring (watchful waiting) and regular tests and exams.  Treatment will depend on the cause of the mass. Follow these instructions at home: What you need to do at home will depend on the cause of the mass. Follow the instructions that your health care provider gives to you. In general:  Take over-the-counter and prescription medicines only as told by your health care provider.  If you are prescribed an antibiotic medicine, take it as told by your health care provider. Do not stop taking the antibiotic even if you start to feel better.  Follow any restrictions that are given to you by your health care provider.  Keep all follow-up visits as told by your health care provider. This is important. ? You may need to see your health care provider once or twice a year to have CT scans and ultrasounds done. These tests will show if your renal mass has changed or grown bigger. Contact a health care provider if you:  Have pain in the side or back (flank pain).  Have a fever.  Feel full soon after eating.  Have pain or swelling in the abdomen.  Lose weight. Get help right away if:  Your pain gets worse.  There is blood in your urine.  You cannot urinate.  You have chest pain.  You have trouble breathing. Summary  A renal mass is a growth in the kidney. It may be cancerous (malignant) and grow or spread quickly, or it may be harmless (benign).  Renal masses may be found while performing   an MRI, CT scan, or ultrasound for other problems of the abdomen.  Your health care provider may recommend that you have tests to diagnose the cause of your renal mass. This may include a physical exam, blood tests, urine tests, imaging, or a biopsy.  Treatment is not always needed for this condition. Careful monitoring (watchful waiting) may be recommended. This information is not intended to replace advice given to you by your health care provider. Make sure you discuss any questions you have with your health care provider. Document Revised: 06/11/2017  Document Reviewed: 06/11/2017 Elsevier Patient Education  2020 Elsevier Inc.  

## 2020-01-27 NOTE — Progress Notes (Signed)
01/27/2020 1:19 PM   Delena Bali 10/07/1940 [id number]  Referring provider: Pieter Partridge, PA [address] [place],  [place] [address]  Right renal mass  HPI: Mr Sitar is a 79yo here for evaluation of a right renal mass. He had an elevated creatinine of 1.49 and underwent renal US which showed a right renal mass. He then had a CT abd which showed a 3cm right mid pole mesophytic posterior renal mass. No flank pain. NO significant LUTS. He is on plavix.     PMH: Past Medical History:  Diagnosis Date  . CAD (coronary artery disease)   . Diabetes mellitus without complication ([place])   . GERD (gastroesophageal reflux disease)   . Heart disease   . 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   Marland Kitchen Hypertension   . Neuropathy   . OA (osteoarthritis) of knee   . Obesity (BMI 30-39.9)   . Obesity hypoventilation syndrome ([place]) 01/24/2013  . S/P CABG x 3 1992    Surgical History: Past Surgical History:  Procedure Laterality Date  . CARDIAC CATHETERIZATION N/A 04/28/2016   Procedure: Left Heart Cath and Cors/Grafts Angiography;  Surgeon: Troy Sine, MD;  Location: [facility];  Service: Cardiovascular;  Laterality: N/A;  . open heart surgeries    . ruptured belly button    . TONSILLECTOMY      Home Medications:  Allergies as of 01/27/2020      Reactions   Niacin And Related Other (See Comments)   headaches      Medication List       Accurate as of January 27, 2020  1:19 PM. If you have any questions, ask your nurse or doctor.        amLODipine 5 MG tablet Commonly known as: NORVASC TAKE 1 TABLET BY MOUTH EVERY DAY   atorvastatin 80 MG tablet Commonly known as: LIPITOR Take 80 mg by mouth daily at 6 PM. One tablet daily   clopidogrel 75 MG tablet Commonly known as: PLAVIX Take 75 mg by mouth daily.   isosorbide mononitrate 30 MG 24 hr tablet Commonly known as: IMDUR TAKE  1 TABLET BY MOUTH EVERY DAY   LEVEMIR [place] Inject 66 Units into the skin daily.   Lyrica 75 MG capsule Generic drug: pregabalin Take 75 mg by mouth at bedtime.   metFORMIN 1000 MG tablet Commonly known as: GLUCOPHAGE Take 1,000 mg by mouth 2 (two) times daily.   metoprolol tartrate 50 MG tablet Commonly known as: LOPRESSOR Take 50 mg by mouth 2 (two) times daily.   nitroGLYCERIN 0.4 MG SL tablet Commonly known as: NITROSTAT Place 1 tablet (0.4 mg total) under the tongue every 5 (five) minutes as needed for chest pain.   NovoTwist 30G X 8 MM Misc Generic drug: Insulin Pen Needle One needle daily   BD Pen Needle Nano 2nd Gen 32G X 4 MM Misc Generic drug: Insulin Pen Needle daily.   omeprazole 20 MG capsule Commonly known as: PRILOSEC Take 1 tablet by mouth daily. What changed: Another medication with the same name was removed. Continue taking this medication, and follow the directions you see here. Changed by: Nicolette Bang, MD   OneTouch Verio test strip Generic drug: glucose blood USE TO TEST BLOOD SUGAR 2-3 TIMES DAILY   tamsulosin 0.4 MG Caps capsule Commonly known as: FLOMAX Take 0.4 mg by mouth daily.   torsemide  20 MG tablet Commonly known as: DEMADEX Take 1 tablet (20 mg total) by mouth 2 (two) times daily. What changed: when to take this   traMADol 50 MG tablet Commonly known as: ULTRAM Take 1 tablet (50 mg total) by mouth every 6 (six) hours as needed.   Victoza 18 MG/3ML Sopn Generic drug: liraglutide Inject 1.8 mg into the skin daily. One needle daily       Allergies:  Allergies  Allergen Reactions  . Niacin And Related Other (See Comments)    headaches    Family History: Family History  Problem Relation Age of Onset  . Cancer - Prostate Father   . Heart attack Father   . Diabetes Sister   . Hypertension Sister   . Diabetes Brother   . Cancer - Prostate Brother   . Heart attack Brother   . Diabetes Paternal Aunt     Social  History:  reports that he quit smoking about 41 years ago. His smoking use included cigars. He has a 62.50 pack-year smoking history. He has never used smokeless tobacco. He reports current alcohol use. He reports that he does not use drugs.  ROS: All other review of systems were reviewed and are negative except what is noted above in HPI  Physical Exam: BP 137/70   Pulse 80   Temp (!) 97.4 F (36.3 C)   Ht 6\' 1"  (1.854 m)   Wt 290 lb (131.5 kg)   BMI 38.26 kg/m   Constitutional:  Alert and oriented, No acute distress. HEENT: [place] AT, moist mucus membranes.  Trachea midline, no masses. Cardiovascular: No clubbing, cyanosis, or edema. Respiratory: Normal respiratory effort, no increased work of breathing. GI: Abdomen is soft, nontender, nondistended, no abdominal masses GU: No CVA tenderness.  Lymph: No cervical or inguinal lymphadenopathy. Skin: No rashes, bruises or suspicious lesions. Neurologic: Grossly intact, no focal deficits, moving all 4 extremities. Psychiatric: Normal mood and affect.  Laboratory Data: Lab Results  Component Value Date   WBC 5.9 04/24/2016   HGB 14.4 04/24/2016   HCT 41.2 04/24/2016   MCV 90.5 04/24/2016   PLT 300 04/24/2016    Lab Results  Component Value Date   CREATININE 1.30 (H) 01/11/2020    No results found for: PSA  No results found for: TESTOSTERONE  Lab Results  Component Value Date   HGBA1C (H) 06/18/2010    7.1 (NOTE)                                                                       According to the ADA Clinical Practice Recommendations for 2011, when HbA1c is used as a screening test:   >=6.5%   Diagnostic of Diabetes Mellitus           (if abnormal result  is confirmed)  5.7-6.4%   Increased risk of developing Diabetes Mellitus  References:Diagnosis and Classification of Diabetes Mellitus,Diabetes WOEH,2122,48(GNOIB 1):S62-S69 and Standards of Medical Care in         Diabetes - 2011,Diabetes Care,2011,34  (Suppl 1):S11-S61.      Urinalysis No results found for: COLORURINE, APPEARANCEUR, LABSPEC, PHURINE, GLUCOSEU, HGBUR, BILIRUBINUR, KETONESUR, PROTEINUR, UROBILINOGEN, NITRITE, LEUKOCYTESUR  No results found for: LABMICR, [place], RBCUA, LABEPIT, MUCUS, BACTERIA  Pertinent Imaging:  CT abd 01/11/2020: Images reviewed and discussed with the patient No results found for this or any previous visit.  No results found for this or any previous visit.  No results found for this or any previous visit.  No results found for this or any previous visit.  Results for orders placed during the hospital encounter of 12/07/19  US RENAL  Narrative CLINICAL DATA:  CKD stage 3  EXAM: RENAL / URINARY TRACT ULTRASOUND COMPLETE  COMPARISON:  None.  FINDINGS: Right Kidney:  Renal measurements: 14.3 x 7.6 x 7.6 cm = volume: 431 mL. There is diffuse cortical thinning. Echogenicity is increased. There is a complex mass in the inferior right kidney measuring 4.6 cm. No hydronephrosis.  Left Kidney:  Renal measurements: 14.3 x 6.1 x 5.9 cm = volume: 268 mL. There is diffuse cortical thinning. Echogenicity is increased. There is a cyst in the inferior pole measuring 3.7 cm. No hydronephrosis.  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  Other:  None.  IMPRESSION: 1. Bilateral renal cortical thinning and increased echogenicity as can be seen in medical renal disease.  2. Indeterminate complex mass in the inferior right kidney measuring 4.6 cm. Recommend contrast-enhanced CT or MRI for further evaluation.  3.  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   Electronically Signed By: Audie Pinto M.D. On: 12/08/2019 16:11  No results found for this or any previous visit.  No results found for this or any previous visit.  No results found for this or any previous visit.   Assessment & Plan:    1. Renal mass We discussed the natural hx of renal masses and  the 80/20 malignant/benign likelihood. We disucssed the treatment options including active surveillance. Renal ablation, partial and radical nephrectomy. I will have him evaluated by Interventional Radiology for possible ablation - POCT urinalysis dipstick   No follow-ups on file.  Nicolette Bang, MD  [facility] [place]

## 2020-02-08 NOTE — Addendum Note (Signed)
Addended by: Cleon Gustin on: 02/08/2020 09:01 AM   Modules accepted: Orders

## 2020-02-09 ENCOUNTER — Encounter (HOSPITAL_COMMUNITY): Payer: Self-pay

## 2020-02-09 ENCOUNTER — Other Ambulatory Visit: Payer: Self-pay | Admitting: Urology

## 2020-02-09 DIAGNOSIS — N2889 Other specified disorders of kidney and ureter: Secondary | ICD-10-CM

## 2020-02-09 NOTE — Progress Notes (Signed)
Blake Hurst Male, 79 y.o., 1941-02-23 MRN:  [id number] Phone:  [phone] [name]) PCP:  Pieter Partridge, PA Primary Cvg:  Medicare/Medicare Part A And B Next Appt With Cardiology 02/17/2020 at 10:45 AM  RE: Biopsy Received: Today Message Details  Markus Daft, MD  [name] Please ignore this order. Dr. Alyson Ingles wants patient to be seen in IR clinic for ablation evaluation.   [name]   Previous Messages  ----- Message -----  From: Lenore Cordia  Sent: 02/09/2020 10:28 AM EDT  To: Ir Procedure Requests  Subject: Biopsy                      Procedure Requested: CT Biopsy    Reason for Procedure: right renal mass    Provider Requesting: Cleon Gustin  Provider Telephone: [phone]    Other Info:

## 2020-02-15 ENCOUNTER — Other Ambulatory Visit: Payer: Self-pay

## 2020-02-15 ENCOUNTER — Ambulatory Visit
Admission: RE | Admit: 2020-02-15 | Discharge: 2020-02-15 | Disposition: A | Discharge status: Home / Self Care | Payer: Medicare Other | Source: Ambulatory Visit | Attending: Urology | Admitting: Urology

## 2020-02-15 ENCOUNTER — Other Ambulatory Visit (HOSPITAL_COMMUNITY): Payer: Self-pay | Admitting: Interventional Radiology

## 2020-02-15 DIAGNOSIS — N2889 Other specified disorders of kidney and ureter: Secondary | ICD-10-CM

## 2020-02-15 NOTE — Consult Note (Signed)
Chief Complaint: Patient was consulted remotely today (TeleHealth) for right renal mass at the request of [place].    Referring Physician(s): McKenzie,Patrick L  History of Present Illness: Blake Hurst is a 79 y.o. male with a history of renal insufficiency, elevated creatinine. 12/07/2019 ultrasound demonstrated 4.6 cm inferior right complex renal mass.  No hydronephrosis. 01/11/2020 follow-up CT confirmed 3.4 cm enhancing right lower pole renal mass, no evidence of metastatic disease. He consulted with Dr. Alyson Ingles and discussed operative management of the lesion which was discouraged given his comorbid conditions including coronary artery disease and renal insufficiency.  Note he is currently on Plavix.  No hematuria.  No lower urinary tract obstructive symptoms.  Past Medical History:  Diagnosis Date  . CAD (coronary artery disease)   . Diabetes mellitus without complication ([place])   . GERD (gastroesophageal reflux disease)   . Heart disease   . 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   Marland Kitchen Hypertension   . Neuropathy   . OA (osteoarthritis) of knee   . Obesity (BMI 30-39.9)   . Obesity hypoventilation syndrome ([place]) 01/24/2013  . S/P CABG x 3 1992    Past Surgical History:  Procedure Laterality Date  . CARDIAC CATHETERIZATION N/A 04/28/2016   Procedure: Left Heart Cath and Cors/Grafts Angiography;  Surgeon: Troy Sine, MD;  Location: [facility];  Service: Cardiovascular;  Laterality: N/A;  . open heart surgeries    . ruptured belly button    . TONSILLECTOMY      Allergies: Niacin and related  Medications: Prior to Admission medications   Medication Sig Start Date End Date Taking? Authorizing Provider  amLODipine (NORVASC) 5 MG tablet TAKE 1 TABLET BY MOUTH EVERY DAY 01/02/20   Patwardhan, Reynold Bowen, MD  atorvastatin (LIPITOR) 80 MG tablet Take 80 mg by mouth daily at 6 PM.  One tablet daily 01/19/13   [provider]  BD PEN NEEDLE NANO 2ND GEN 32G X 4 MM MISC daily. 11/22/19   [provider]  clopidogrel (PLAVIX) 75 MG tablet Take 75 mg by mouth daily.  01/19/13   [provider]  glucose blood (ONETOUCH VERIO) test strip USE TO TEST BLOOD SUGAR 2-3 TIMES DAILY 09/05/19   [provider]  Insulin Detemir (LEVEMIR [place]) Inject 66 Units into the skin daily.     [provider]  isosorbide mononitrate (IMDUR) 30 MG 24 hr tablet TAKE 1 TABLET BY MOUTH EVERY DAY 07/08/16   Hilty, Nadean Corwin, MD  metFORMIN (GLUCOPHAGE) 1000 MG tablet Take 1,000 mg by mouth 2 (two) times daily.  01/19/13   [provider]  metoprolol (LOPRESSOR) 50 MG tablet Take 50 mg by mouth 2 (two) times daily.  01/19/13   [provider]  nitroGLYCERIN (NITROSTAT) 0.4 MG SL tablet Place 1 tablet (0.4 mg total) under the tongue every 5 (five) minutes as needed for chest pain. 04/08/16 11/14/19  Hilty, Nadean Corwin, MD  NOVOTWIST 30G X 8 MM MISC One needle daily 01/20/13   [provider]  omeprazole (PRILOSEC) 20 MG capsule Take 1 tablet by mouth daily. 11/28/19   [provider]  pregabalin (LYRICA) 75 MG capsule Take 75 mg by mouth at bedtime.     [provider]  tamsulosin (FLOMAX) 0.4 MG CAPS capsule Take 0.4 mg by mouth daily. 10/18/19   [provider]  torsemide (  DEMADEX) 20 MG tablet Take 1 tablet (20 mg total) by mouth 2 (two) times daily. Patient taking differently: Take 20 mg by mouth daily.  10/03/19 01/01/20  Patwardhan, Reynold Bowen, MD  traMADol (ULTRAM) 50 MG tablet Take 1 tablet (50 mg total) by mouth every 6 (six) hours as needed. Patient not taking: Reported on 01/27/2020 04/08/16   Pixie Casino, MD  VICTOZA 18 MG/3ML SOPN Inject 1.8 mg into the skin daily. One needle daily 12/29/12   [provider]     Family History  Problem Relation Age of Onset  . Cancer - Prostate Father   . Heart attack  Father   . Diabetes Sister   . Hypertension Sister   . Diabetes Brother   . Cancer - Prostate Brother   . Heart attack Brother   . Diabetes Paternal Aunt     Social History   Socioeconomic History  . Marital status: Divorced    Spouse name: Not on file  . Number of children: 4  . Years of education: 74  . Highest education level: Not on file  Occupational History  . Occupation: retired    Fish farm manager: RETIRED    Comment: Editor, commissioning  Tobacco Use  . Smoking status: Former Smoker    Packs/day: 2.50    Years: 25.00    Pack years: 62.50    Types: Cigars    Quit date: 1980    Years since quitting: 41.7  . Smokeless tobacco: Never Used  . Tobacco comment: quit 35-40 years ago  Vaping Use  . Vaping Use: Never used  Substance and Sexual Activity  . Alcohol use: Yes    Comment: occas. drink  . Drug use: No  . Sexual activity: Not on file  Other Topics Concern  . Not on file  Social History Narrative   epworth sleepiness scale scrore: 17   Social Determinants of Health   Financial Resource Strain:   . Difficulty of Paying Living Expenses: Not on file  Food Insecurity:   . Worried About Charity fundraiser in the Last Year: Not on file  . Ran Out of Food in the Last Year: Not on file  Transportation Needs:   . Lack of Transportation (Medical): Not on file  . Lack of Transportation (Non-Medical): Not on file  Physical Activity:   . Days of Exercise per Week: Not on file  . Minutes of Exercise per Session: Not on file  Stress:   . Feeling of Stress : Not on file  Social Connections:   . Frequency of Communication with Friends and Family: Not on file  . Frequency of Social Gatherings with Friends and Family: Not on file  . Attends Religious Services: Not on file  . Active Member of Clubs or Organizations: Not on file  . Attends Archivist Meetings: Not on file  . Marital Status: Not on file    ECOG Status: 0 - Asymptomatic  Review of  Systems  Review of Systems: A 12 point ROS discussed and pertinent positives are indicated in the HPI above.  All other systems are negative.  Physical Exam No direct physical exam was performed (except for noted visual exam findings with Video Visits).     Vital Signs: There were no vitals taken for this visit.  Imaging: No results found.  Labs:  Ref Range & Units 1 mo ago  (01/11/20) 11 mo ago  (02/23/19) 3 yr ago  (04/24/16) 9 yr ago  (06/27/10) 9  yr ago  (06/26/10)  Creatinine, Ser 0.61 - 1.24 mg/dL 1.30High  1.49High R  1.14 R, CM  0.98 R  1.08 R         CBC: No results for input(s): WBC, HGB, HCT, PLT in the last 8760 hours.  COAGS: No results for input(s): INR, APTT in the last 8760 hours.  BMP: Recent Labs    02/23/19 1106 01/11/20 1255  NA 137  --   K 5.0  --   CL 99  --   CO2 20  --   GLUCOSE 160*  --   BUN 20  --   CALCIUM 10.4*  --   CREATININE 1.49* 1.30*  GFRNONAA 44*  --   GFRAA 51*  --     LIVER FUNCTION TESTS: No results for input(s): BILITOT, AST, ALT, ALKPHOS, PROT, ALBUMIN in the last 8760 hours.  TUMOR MARKERS: No results for input(s): AFPTM, CEA, CA199, CHROMGRNA in the last 8760 hours.  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  Outside chance of oncocytoma.  Nephrectomy or partial nephrectomy is relatively contraindicated due to his renal insufficiency and surgical risks with known coronary disease.  The lesion is approachable for percutaneous ablation, without significant proximity to other organs or major vasculature.  I discussed with the patient and his son the pathophysiology of renal cell carcinoma, treatment options including watchful waiting, surgery, and percutaneous ablation.  We discussed in technique the CT-guided percutaneous cryoablation technique with concurrent core needle biopsy, anticipated benefits, post procedure  recovery expectations, and need for Continued imaging surveillance x5 years, assuming this is indeed a carcinoma.  They seem to understand and did ask appropriate questions.  They were motivated to proceed. .  Accordingly, we can set this up at his convenience under CT guidance with anesthesia, with plans for overnight to extended observation.  We will need to contact his cardiologist Dr. Virgina Jock . to coordinate holding Plavix for the procedure with bridging if needed.  Thank you for this interesting consult.  I greatly enjoyed meeting Khi Mcmillen and look forward to participating in their care.  A copy of this report was sent to the requesting provider on this date.  Electronically Signed: Rickard Rhymes 02/15/2020, 3:31 PM   I spent a total of  30 Minutes   in remote  clinical consultation, greater than 50% of which was counseling/coordinating care for Right renal neoplasm.    Visit type: Audio only (telephone). Audio (no video) only due to patient's lack of internet/smartphone capability. Alternative for in-person consultation at [facility], [address], [place], [place]. This visit type was conducted due to national recommendations for restrictions regarding the COVID-19 Pandemic (e.g. social distancing).  This format is felt to be most appropriate for this patient at this time.  All issues noted in this document were discussed and addressed.

## 2020-02-16 ENCOUNTER — Encounter: Payer: Self-pay | Admitting: *Deleted

## 2020-02-16 ENCOUNTER — Encounter: Payer: Self-pay | Admitting: Cardiology

## 2020-02-16 DIAGNOSIS — I5033 Acute on chronic diastolic (congestive) heart failure: Secondary | ICD-10-CM | POA: Diagnosis not present

## 2020-02-16 HISTORY — PX: IR RADIOLOGIST EVAL & MGMT: IMG5224

## 2020-02-17 ENCOUNTER — Other Ambulatory Visit: Payer: Self-pay

## 2020-02-17 ENCOUNTER — Encounter: Payer: Self-pay | Admitting: Cardiology

## 2020-02-17 ENCOUNTER — Ambulatory Visit: Payer: Medicare Other | Admitting: Cardiology

## 2020-02-17 VITALS — BP 135/64 | HR 75 | Ht 73.0 in | Wt 295.0 lb

## 2020-02-17 DIAGNOSIS — I10 Essential (primary) hypertension: Secondary | ICD-10-CM

## 2020-02-17 DIAGNOSIS — I25708 Atherosclerosis of coronary artery bypass graft(s), unspecified, with other forms of angina pectoris: Secondary | ICD-10-CM | POA: Diagnosis not present

## 2020-02-17 DIAGNOSIS — I5032 Chronic diastolic (congestive) heart failure: Secondary | ICD-10-CM | POA: Diagnosis not present

## 2020-02-17 NOTE — Progress Notes (Signed)
Follow up visit  Subjective:   Delena Bali, male    DOB: Oct 18, 1940, 79 y.o.   MRN: [id number]  HPI   Chief Complaint  Patient presents with   Hypertension    pt to have biopsy, ct scan on right kidney   Coronary Artery Disease    79 y.o. Caucasian male with hypertension, type 2 diabetes mellitus, hyperlipidemia, morbid obesity, coronary artery disease CABG 1996, redo CABG 2012, with patent LIMA-LAD, right radial-OM, occluded SVG-RCA and SVG-diagonal, with severe native vessel disease.  Patient is doing well. His avg BP is 126/72 mmHg. He was recently found to have renal mass, for which he is going to undergo interventional treatment through urology. He denies chest pain, shortness of breath, palpitations, leg edema, orthopnea, PND, TIA/syncope.  Current Outpatient Medications on File Prior to Visit  Medication Sig Dispense Refill   amLODipine (NORVASC) 5 MG tablet TAKE 1 TABLET BY MOUTH EVERY DAY 90 tablet 1   atorvastatin (LIPITOR) 80 MG tablet Take 80 mg by mouth daily at 6 PM. One tablet daily     BD PEN NEEDLE NANO 2ND GEN 32G X 4 MM MISC daily.     clopidogrel (PLAVIX) 75 MG tablet Take 75 mg by mouth daily.      glucose blood (ONETOUCH VERIO) test strip USE TO TEST BLOOD SUGAR 2-3 TIMES DAILY     Insulin Detemir (LEVEMIR [place]) Inject 66 Units into the skin daily.      isosorbide mononitrate (IMDUR) 30 MG 24 hr tablet TAKE 1 TABLET BY MOUTH EVERY DAY 30 tablet 9   lisinopril (ZESTRIL) 10 MG tablet Take 10 mg by mouth daily.     metFORMIN (GLUCOPHAGE) 1000 MG tablet Take 1,000 mg by mouth 2 (two) times daily.      metoprolol (LOPRESSOR) 50 MG tablet Take 50 mg by mouth 2 (two) times daily.      NOVOTWIST 30G X 8 MM MISC One needle daily     omeprazole (PRILOSEC) 20 MG capsule Take 1 tablet by mouth daily.     pregabalin (LYRICA) 75 MG capsule Take 75 mg by mouth at bedtime.      torsemide (DEMADEX) 20 MG tablet Take 1 tablet (20 mg total) by mouth 2 (two)  times daily. (Patient taking differently: Take 20 mg by mouth daily. ) 60 tablet 3   traMADol (ULTRAM) 50 MG tablet Take 1 tablet (50 mg total) by mouth every 6 (six) hours as needed. 30 tablet 1   VICTOZA 18 MG/3ML SOPN Inject 1.8 mg into the skin daily. One needle daily     nitroGLYCERIN (NITROSTAT) 0.4 MG SL tablet Place 1 tablet (0.4 mg total) under the tongue every 5 (five) minutes as needed for chest pain. 25 tablet 3   tamsulosin (FLOMAX) 0.4 MG CAPS capsule Take 0.4 mg by mouth daily.     No current facility-administered medications on file prior to visit.    Cardiovascular & other pertient studies:  EKG 10/03/2019: Sinus rhythm 73 bpm Right bundle branch block Left anterior fascicular block Low voltage precordial leads  ABI 02/17/2019:  This exam reveals normal perfusion of bilateral lower extremity (ABI  1.00). Abnormal waveform at the ankle suggests small vessel disease.  Clinical correlation recommended.  Carotid duplex 02/17/2019 (Preliminary result): Mild stenosis in the left external carotid artery (<50%). Antegrade right vertebral artery flow. Antegrade left vertebral artery flow. Follow up in one year is appropriate if clinically indicated.  Echocardiogram 02/17/2019: Left ventricle cavity is normal in  size. Moderate concentric hypertrophy of the left ventricle. Abnormal septal wall motion due to post-operative coronary artery bypass graft. Mildly depressed LV systolic function with EF 45%. Doppler evidence of grade I (impaired) diastolic dysfunction, normal LAP.  Left atrial cavity is mildly dilated. Structurally normal trileaflet aortic valve with no regurgitation noted. Trace aortic valve stenosis. Trace MR, trace TR, trace PI. Normal right atrial pressure.   Coronary and bypass graft angiography 04/28/2016: LM: Normal LAD: 50% ostial, 80% mid LAD stenoses, 90% ostial septal perforator stneosis. LCx: 100% ostial occlusion RCA: Small caliber vessel. Severe RV  marginal stenosis LIMA-LAD: Patent Radial artery-OM: Patent SVG-RCA: Occluded SVG-???: Prior stents. Occluded  Nuclear stress test 04/23/2016: No diagnostic ST segment changes to indicate ischemia. Small, moderate intensity, apical to basal inferior defect that exhibits partial reversibility apex and fixed toward the base. This is consistent with scar with peri-infarct ischemia towards the apex. Moderate-sized, severe intensity, mid to basal inferolateral and anterolateral defect that is partially reversible throughout and consistent with moderate ischemic territory. This is an intermediate risk study. Nuclear stress EF: 49%.    Recent labs: 11/01/2019: Glucose 105, BUN/Cr 18/1.17. EGFR 59. Na/K 143/4.1.   10/18/2019: Glucose 142, BUN/Cr 29/2.09. EGFR 29. Na/K 139/4.5.   10/04/2019: Glucose 125, BUN/Cr 30/1.61. EGFR 40. Na/K 139/5.2  HbA1C 7.7%  02/23/2019: Glucose 160.  BUN/creatinine 20/1.49.  EGFR 44.  Sodium 137, potassium 5.0.  01/05/2019: BUN/Cr 33/1.58. eGFR 41/ Na/K 139/4.3.   12/21/2018: Cr 1.32. eGFR 51. HbA1C 7.2%. Chol 214, TG 166, HDL 33, LDL 69.    Review of Systems  Constitutional: Negative for malaise/fatigue.  Cardiovascular: Positive for leg swelling (improved). Negative for chest pain, dyspnea on exertion (stable), near-syncope, palpitations and syncope.  Neurological: Positive for paresthesias.         Vitals:   02/17/20 1030  BP: 135/64  Pulse: 75  SpO2: 97%     Body mass index is 38.92 kg/m. Filed Weights   02/17/20 1030  Weight: 295 lb (133.8 kg)     Objective:   Physical Exam Vitals and nursing note reviewed.  Constitutional:      General: He is not in acute distress.    Comments: Moderately obese  Neck:     Vascular: No JVD.  Cardiovascular:     Rate and Rhythm: Normal rate and regular rhythm.     Pulses: Intact distal pulses.          Carotid pulses are on the left side with bruit.      Femoral pulses are 2+ on the right  side and 2+ on the left side.      Popliteal pulses are 1+ on the right side and 1+ on the left side.       Dorsalis pedis pulses are 0 on the right side and 0 on the left side.       Posterior tibial pulses are 0 on the right side and 0 on the left side.     Heart sounds: Normal heart sounds. No murmur heard.      Comments: Delayed capillary refill, without any ulcers, wounds, gangrene.  Pulmonary:     Effort: Pulmonary effort is normal.     Breath sounds: Normal breath sounds. No wheezing or rales.  Musculoskeletal:     Right lower leg: Edema (Trace) present.     Left lower leg: Edema (Trace) present.           Assessment & Recommendations:   79 year old Caucasian male with hypertension,  type 2 diabetes mellitus, hyperlipidemia, morbid obesity, coronary artery disease CABG 1996, redo CABG 2012, with patent LIMA-LAD, right radial-OM, occluded SVG-RCA and SVG-diagonal, with severe native vessel disease, here to establish cardiac care for management of coronary artery disease.   CAD with stable angina: H/o CABG and redo CABG, 2/4 grafts patent, severe native vessel CAD on cath in 2017. Angina symptoms fairly well controlled with current medical therapy.  Continue Plavix, statin, metoprolol, Imdur.  Low risk for urological procedure. Plavix can be stopped 5 days before the procedure.   Hypertension: Well controlled.  F/u in 6 months   Layla Gramm Esther Hardy, MD [facility] Pager: [phone] Office: [phone]

## 2020-02-29 ENCOUNTER — Ambulatory Visit: Payer: Medicare Other | Admitting: Urology

## 2020-03-01 NOTE — Patient Instructions (Addendum)
DUE TO COVID-19 ONLY ONE VISITOR IS ALLOWED TO COME WITH YOU AND STAY IN THE WAITING ROOM ONLY DURING PRE OP AND PROCEDURE DAY OF SURGERY. THE 1 VISITOR  MAY VISIT WITH YOU AFTER SURGERY IN YOUR PRIVATE ROOM DURING VISITING HOURS ONLY!  YOU NEED TO HAVE A COVID 19 TEST ON_10/23______ @__11 :55_____, THIS TEST MUST BE DONE BEFORE SURGERY,  COVID TESTING SITE [place]  [address], IT IS ON THE RIGHT GOING OUT [address] APPROXIMATELY  2 MINUTES PAST [employer] ON THE RIGHT. ONCE YOUR COVID TEST IS COMPLETED,  PLEASE BEGIN THE QUARANTINE INSTRUCTIONS AS OUTLINED IN YOUR HANDOUT.                Delena Bali    Your procedure is scheduled on: 03/14/20   Report to [facility] Main  Entrance   Report to Radiology  At  10:00 AM     Call this number if you have problems the morning of surgery [phone]    Remember: Do not eat food or drink liquids :After Midnight.   BRUSH YOUR TEETH MORNING OF SURGERY AND RINSE YOUR MOUTH OUT, NO CHEWING GUM CANDY OR MINTS.     Take these medicines the morning of surgery with A SIP OF WATER: Metoprolol, Amlodipine, Tamsulosin, Isorbide, Omeprizole   How to Manage Your Diabetes Before and After Surgery  Why is it important to control my blood sugar before and after surgery? . Improving blood sugar levels before and after surgery helps healing and can limit problems. . A way of improving blood sugar control is eating a healthy diet by: o  Eating less sugar and carbohydrates o  Increasing activity/exercise o  Talking with your doctor about reaching your blood sugar goals . High blood sugars (greater than 180 mg/dL) can raise your risk of infections and slow your recovery, so you will need to focus on controlling your diabetes during the weeks before surgery. . Make sure that the doctor who takes care of your diabetes knows about your planned surgery including the date and location.  How do I manage my blood  sugar before surgery? . Check your blood sugar at least 4 times a day, starting 2 days before surgery, to make sure that the level is not too high or low. o Check your blood sugar the morning of your surgery when you wake up and every 2 hours until you get to the Short Stay unit. . If your blood sugar is less than 70 mg/dL, you will need to treat for low blood sugar: o Do not take insulin. o Treat a low blood sugar (less than 70 mg/dL) with  cup of clear juice (cranberry or apple), 4 glucose tablets, OR glucose gel. o Recheck blood sugar in 15 minutes after treatment (to make sure it is greater than 70 mg/dL). If your blood sugar is not greater than 70 mg/dL on recheck, call [phone] for further instructions. . Report your blood sugar to the short stay nurse when you get to Short Stay.  . If you are admitted to the hospital after surgery: o Your blood sugar will be checked by the staff and you will probably be given insulin after surgery (instead of oral diabetes medicines) to make sure you have good blood sugar levels. o The goal for blood sugar control after surgery is 80-180 mg/dL.   WHAT DO I DO ABOUT MY DIABETES MEDICATION?  Marland Kitchen Do not take oral diabetes medicines (pills) the morning  of surgery.      . THE MORNING OF SURGERY, take 50% of your regular dose  32   units of detemir   insulin.  . The day of surgery, do not take other diabetes injectables, including Byetta (exenatide), Bydureon (exenatide ER), Victoza (liraglutide), or Trulicity (dulaglutide).                                You may not have any metal on your body including              piercings  Do not wear jewelry, , lotions, powders or deodorant           .              Men may shave face and neck.   Do not bring valuables to the hospital. [place].  Contacts, dentures or bridgework may not be worn into surgery.                  Please read over the following  fact sheets you were given: _____________________________________________________________________             [facility] - Preparing for Surgery Before surgery, you can play an important role.   Because skin is not sterile, your skin needs to be as free of germs as possible .  You can reduce the number of germs on your skin by washing with CHG (chlorahexidine gluconate) soap before surgery.   CHG is an antiseptic cleaner which kills germs and bonds with the skin to continue killing germs even after washing. Please DO NOT use if you have an allergy to CHG or antibacterial soaps.   If your skin becomes reddened/irritated stop using the CHG and inform your nurse when you arrive at Short Stay. .  You may shave your face/neck. Please follow these instructions carefully:  1.  Shower with CHG Soap the night before surgery and the  morning of Surgery.  2.  If you choose to wash your hair, wash your hair first as usual with your  normal  shampoo.  3.  After you shampoo, rinse your hair and body thoroughly to remove the  shampoo.                                        4.  Use CHG as you would any other liquid soap.  You can apply chg directly  to the skin and wash                       Gently with a scrungie or clean washcloth.  5.  Apply the CHG Soap to your body ONLY FROM THE NECK DOWN.   Do not use on face/ open                           Wound or open sores. Avoid contact with eyes, ears mouth and genitals (private parts).                       Wash face,  Genitals (private parts) with your normal soap.  6.  Wash thoroughly, paying special attention to the area where your surgery  will be performed.  7.  Thoroughly rinse your body with warm water from the neck down.  8.  DO NOT shower/wash with your normal soap after using and rinsing off  the CHG Soap.             9.  Pat yourself dry with a clean towel.            10.  Wear clean pajamas.            11.  Place clean sheets on your  bed the night of your first shower and do not  sleep with pets. Day of Surgery : Do not apply any lotions/deodorants the morning of surgery.  Please wear clean clothes to the hospital/surgery center.  FAILURE TO FOLLOW THESE INSTRUCTIONS MAY RESULT IN THE CANCELLATION OF YOUR SURGERY PATIENT SIGNATURE_________________________________  NURSE SIGNATURE__________________________________  ________________________________________________________________________

## 2020-03-05 ENCOUNTER — Other Ambulatory Visit: Payer: Self-pay

## 2020-03-05 ENCOUNTER — Encounter (HOSPITAL_COMMUNITY)
Admission: RE | Admit: 2020-03-05 | Discharge: 2020-03-05 | Disposition: A | Discharge status: Home / Self Care | Payer: Medicare Other | Source: Ambulatory Visit | Attending: Interventional Radiology | Admitting: Interventional Radiology

## 2020-03-05 ENCOUNTER — Encounter (HOSPITAL_COMMUNITY): Payer: Self-pay

## 2020-03-05 DIAGNOSIS — Z87891 Personal history of nicotine dependence: Secondary | ICD-10-CM | POA: Diagnosis not present

## 2020-03-05 DIAGNOSIS — K219 Gastro-esophageal reflux disease without esophagitis: Secondary | ICD-10-CM | POA: Diagnosis not present

## 2020-03-05 DIAGNOSIS — I252 Old myocardial infarction: Secondary | ICD-10-CM | POA: Diagnosis not present

## 2020-03-05 DIAGNOSIS — Z01818 Encounter for other preprocedural examination: Secondary | ICD-10-CM | POA: Diagnosis not present

## 2020-03-05 DIAGNOSIS — I2510 Atherosclerotic heart disease of native coronary artery without angina pectoris: Secondary | ICD-10-CM | POA: Diagnosis not present

## 2020-03-05 DIAGNOSIS — Z794 Long term (current) use of insulin: Secondary | ICD-10-CM | POA: Diagnosis not present

## 2020-03-05 DIAGNOSIS — G629 Polyneuropathy, unspecified: Secondary | ICD-10-CM | POA: Diagnosis not present

## 2020-03-05 DIAGNOSIS — M1710 Unilateral primary osteoarthritis, unspecified knee: Secondary | ICD-10-CM | POA: Insufficient documentation

## 2020-03-05 DIAGNOSIS — E669 Obesity, unspecified: Secondary | ICD-10-CM | POA: Diagnosis not present

## 2020-03-05 DIAGNOSIS — E1142 Type 2 diabetes mellitus with diabetic polyneuropathy: Secondary | ICD-10-CM | POA: Insufficient documentation

## 2020-03-05 DIAGNOSIS — I10 Essential (primary) hypertension: Secondary | ICD-10-CM | POA: Insufficient documentation

## 2020-03-05 DIAGNOSIS — N2889 Other specified disorders of kidney and ureter: Secondary | ICD-10-CM | POA: Diagnosis not present

## 2020-03-05 DIAGNOSIS — Z79899 Other long term (current) drug therapy: Secondary | ICD-10-CM | POA: Insufficient documentation

## 2020-03-05 DIAGNOSIS — Z6830 Body mass index (BMI) 30.0-30.9, adult: Secondary | ICD-10-CM | POA: Diagnosis not present

## 2020-03-05 DIAGNOSIS — Z7901 Long term (current) use of anticoagulants: Secondary | ICD-10-CM | POA: Insufficient documentation

## 2020-03-05 HISTORY — DX: Polyneuropathy, unspecified: G62.9

## 2020-03-05 HISTORY — DX: Tremor, unspecified: R25.1

## 2020-03-05 HISTORY — DX: Acute myocardial infarction, unspecified: I21.9

## 2020-03-05 HISTORY — DX: Cardiac murmur, unspecified: R01.1

## 2020-03-05 LAB — CBC
HCT: 40.6 % (ref 39.0–52.0)
Hemoglobin: 13.3 g/dL (ref 13.0–17.0)
MCH: 29.4 pg (ref 26.0–34.0)
MCHC: 32.8 g/dL (ref 30.0–36.0)
MCV: 89.8 fL (ref 80.0–100.0)
Platelets: 314 10*3/uL (ref 150–400)
RBC: 4.52 MIL/uL (ref 4.22–5.81)
RDW: 14.2 % (ref 11.5–15.5)
WBC: 7.5 10*3/uL (ref 4.0–10.5)
nRBC: 0 % (ref 0.0–0.2)

## 2020-03-05 LAB — BASIC METABOLIC PANEL
Anion gap: 11 (ref 5–15)
BUN: 15 mg/dL (ref 8–23)
CO2: 27 mmol/L (ref 22–32)
Calcium: 9.3 mg/dL (ref 8.9–10.3)
Chloride: 101 mmol/L (ref 98–111)
Creatinine, Ser: 1.26 mg/dL — ABNORMAL HIGH (ref 0.61–1.24)
GFR, Estimated: 54 mL/min — ABNORMAL LOW (ref >60)
Glucose, Bld: 115 mg/dL — ABNORMAL HIGH (ref 70–99)
Potassium: 4.1 mmol/L (ref 3.5–5.1)
Sodium: 139 mmol/L (ref 135–145)

## 2020-03-05 LAB — GLUCOSE, CAPILLARY: Glucose-Capillary: 118 mg/dL — ABNORMAL HIGH (ref 70–99)

## 2020-03-05 LAB — HEMOGLOBIN A1C
Hgb A1c MFr Bld: 6.7 % — ABNORMAL HIGH (ref 4.8–5.6)
Mean Plasma Glucose: 146 mg/dL

## 2020-03-05 NOTE — Progress Notes (Signed)
COVID Vaccine Completed:Yes Date COVID Vaccine completed:08/17/19 Booster 02/14/20 COVID vaccine manufacturer: Pfizer     PCP - Dr. Leonette Nutting Cardiologist - Dr. [name]. Patwardhan  Chest x-ray - no EKG - 10/03/19-Epic Stress Test - no ECHO - 02/17/19-Epic Cardiac Cath - 2017 Epic Pacemaker/ICD device last checked:NA  Sleep Study - yes- negative findings CPAP - No  Fasting Blood Sugar - 115-130 Checks Blood Sugar _BID____ times a day  Blood Thinner Instructions:Plavix/ [place] Asp[facility]in Instructions:Stop 5 days prior to DOS/ Alyson Ingles Last Dose:03/08/20  Anesthesia review:   Patient denies shortness of breath, fever, cough and chest pain at PAT appointment yes   Patient verbalized understanding of instructions that were given to them at the PAT appointment. Patient was also instructed that they will need to review over the PAT instructions again at home before surgery. Yes Pt is able to climb one flight of stairs. He nas no SOB working around the house and with ADLs
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
## 2020-03-07 NOTE — Anesthesia Preprocedure Evaluation (Addendum)
Anesthesia Evaluation  Patient identified by MRN, date of birth, ID band Patient awake    Reviewed: Allergy & Precautions, NPO status , Patient's Chart, lab work & pertinent test results, reviewed documented beta blocker date and time   Airway Mallampati: II  TM Distance: >3 FB Neck ROM: Full    Dental no notable dental hx.    Pulmonary neg pulmonary ROS, former smoker,    Pulmonary exam normal breath sounds clear to auscultation       Cardiovascular hypertension, Pt. on medications and Pt. on home beta blockers + angina + CAD and + Past MI  Normal cardiovascular exam Rhythm:Regular Rate:Normal     Neuro/Psych negative neurological ROS  negative psych ROS   GI/Hepatic Neg liver ROS, GERD  ,  Endo/Other  negative endocrine ROSdiabetes  Renal/GU Renal disease  negative genitourinary   Musculoskeletal  (+) Arthritis , Osteoarthritis,    Abdominal (+) + obese,   Peds negative pediatric ROS (+)  Hematology negative hematology ROS (+)   Anesthesia Other Findings Renal Mass  Reproductive/Obstetrics negative OB ROS                            Anesthesia Physical Anesthesia Plan  ASA: III  Anesthesia Plan: General   Post-op Pain Management:    Induction: Intravenous  PONV Risk Score and Plan: 2 and Ondansetron, Midazolam and Treatment may vary due to age or medical condition  Airway Management Planned: Oral ETT  Additional Equipment:   Intra-op Plan:   Post-operative Plan: Extubation in OR  Informed Consent: I have reviewed the patients History and Physical, chart, labs and discussed the procedure including the risks, benefits and alternatives for the proposed anesthesia with the patient or authorized representative who has indicated his/her understanding and acceptance.     Dental advisory given  Plan Discussed with: CRNA  Anesthesia Plan Comments: (See PAT note 03/05/2020,  Konrad Felix, PA-C)       Anesthesia Quick Evaluation

## 2020-03-10 ENCOUNTER — Other Ambulatory Visit (HOSPITAL_COMMUNITY)
Admission: RE | Admit: 2020-03-10 | Discharge: 2020-03-10 | Disposition: A | Discharge status: Home / Self Care | Payer: Medicare Other | Source: Ambulatory Visit | Attending: Interventional Radiology | Admitting: Interventional Radiology

## 2020-03-10 DIAGNOSIS — Z20822 Contact with and (suspected) exposure to covid-19: Secondary | ICD-10-CM | POA: Insufficient documentation

## 2020-03-10 DIAGNOSIS — Z01812 Encounter for preprocedural laboratory examination: Secondary | ICD-10-CM | POA: Insufficient documentation

## 2020-03-10 LAB — SARS CORONAVIRUS 2 (TAT 6-24 HRS): SARS Coronavirus 2: NEGATIVE

## 2020-03-13 ENCOUNTER — Other Ambulatory Visit: Payer: Self-pay | Admitting: Physician Assistant

## 2020-03-13 MED ORDER — DEXTROSE 5 % IV SOLN: 2.0000 g | Freq: Once | INTRAVENOUS | Status: DC

## 2020-03-14 ENCOUNTER — Other Ambulatory Visit: Payer: Self-pay

## 2020-03-14 ENCOUNTER — Encounter (HOSPITAL_COMMUNITY)
Admission: RE | Disposition: A | Discharge status: Home / Self Care | Payer: Self-pay | Source: Ambulatory Visit | Attending: Interventional Radiology

## 2020-03-14 ENCOUNTER — Ambulatory Visit (HOSPITAL_COMMUNITY): Payer: Medicare Other | Admitting: Physician Assistant

## 2020-03-14 ENCOUNTER — Ambulatory Visit (HOSPITAL_COMMUNITY)
Admission: RE | Admit: 2020-03-14 | Discharge: 2020-03-14 | Disposition: A | Discharge status: Home / Self Care | Payer: Medicare Other | Source: Ambulatory Visit | Attending: Interventional Radiology | Admitting: Interventional Radiology

## 2020-03-14 ENCOUNTER — Encounter (HOSPITAL_COMMUNITY): Payer: Self-pay

## 2020-03-14 ENCOUNTER — Encounter (HOSPITAL_COMMUNITY): Payer: Self-pay | Admitting: Interventional Radiology

## 2020-03-14 ENCOUNTER — Ambulatory Visit (HOSPITAL_COMMUNITY)
Admission: RE | Admit: 2020-03-14 | Discharge: 2020-03-15 | Disposition: A | Discharge status: Home / Self Care | Payer: Medicare Other | Source: Ambulatory Visit | Attending: Interventional Radiology | Admitting: Interventional Radiology

## 2020-03-14 ENCOUNTER — Ambulatory Visit (HOSPITAL_COMMUNITY): Payer: Medicare Other | Admitting: Certified Registered Nurse Anesthetist

## 2020-03-14 DIAGNOSIS — I5032 Chronic diastolic (congestive) heart failure: Secondary | ICD-10-CM | POA: Diagnosis not present

## 2020-03-14 DIAGNOSIS — Z6838 Body mass index (BMI) 38.0-38.9, adult: Secondary | ICD-10-CM | POA: Diagnosis not present

## 2020-03-14 DIAGNOSIS — E669 Obesity, unspecified: Secondary | ICD-10-CM | POA: Insufficient documentation

## 2020-03-14 DIAGNOSIS — N2889 Other specified disorders of kidney and ureter: Secondary | ICD-10-CM

## 2020-03-14 DIAGNOSIS — C641 Malignant neoplasm of right kidney, except renal pelvis: Secondary | ICD-10-CM | POA: Diagnosis present

## 2020-03-14 DIAGNOSIS — Z87891 Personal history of nicotine dependence: Secondary | ICD-10-CM | POA: Diagnosis not present

## 2020-03-14 DIAGNOSIS — I130 Hypertensive heart and chronic kidney disease with heart failure and stage 1 through stage 4 chronic kidney disease, or unspecified chronic kidney disease: Secondary | ICD-10-CM | POA: Diagnosis not present

## 2020-03-14 DIAGNOSIS — N1832 Chronic kidney disease, stage 3b: Secondary | ICD-10-CM | POA: Diagnosis not present

## 2020-03-14 HISTORY — PX: RADIOLOGY WITH ANESTHESIA: SHX6223

## 2020-03-14 LAB — CBC WITH DIFFERENTIAL/PLATELET
Abs Immature Granulocytes: 0.03 10*3/uL (ref 0.00–0.07)
Basophils Absolute: 0.1 10*3/uL (ref 0.0–0.1)
Basophils Relative: 2 %
Eosinophils Absolute: 0.3 10*3/uL (ref 0.0–0.5)
Eosinophils Relative: 5 %
HCT: 41.3 % (ref 39.0–52.0)
Hemoglobin: 13.7 g/dL (ref 13.0–17.0)
Immature Granulocytes: 0 %
Lymphocytes Relative: 21 %
Lymphs Abs: 1.6 10*3/uL (ref 0.7–4.0)
MCH: 29.3 pg (ref 26.0–34.0)
MCHC: 33.2 g/dL (ref 30.0–36.0)
MCV: 88.2 fL (ref 80.0–100.0)
Monocytes Absolute: 0.9 10*3/uL (ref 0.1–1.0)
Monocytes Relative: 12 %
Neutro Abs: 4.6 10*3/uL (ref 1.7–7.7)
Neutrophils Relative %: 60 %
Platelets: 310 10*3/uL (ref 150–400)
RBC: 4.68 MIL/uL (ref 4.22–5.81)
RDW: 14 % (ref 11.5–15.5)
WBC: 7.6 10*3/uL (ref 4.0–10.5)
nRBC: 0 % (ref 0.0–0.2)

## 2020-03-14 LAB — GLUCOSE, CAPILLARY
Glucose-Capillary: 147 mg/dL — ABNORMAL HIGH (ref 70–99)
Glucose-Capillary: 150 mg/dL — ABNORMAL HIGH (ref 70–99)
Glucose-Capillary: 181 mg/dL — ABNORMAL HIGH (ref 70–99)
Glucose-Capillary: 285 mg/dL — ABNORMAL HIGH (ref 70–99)

## 2020-03-14 LAB — BASIC METABOLIC PANEL
Anion gap: 13 (ref 5–15)
BUN: 11 mg/dL (ref 8–23)
CO2: 24 mmol/L (ref 22–32)
Calcium: 9.6 mg/dL (ref 8.9–10.3)
Chloride: 101 mmol/L (ref 98–111)
Creatinine, Ser: 1.02 mg/dL (ref 0.61–1.24)
GFR, Estimated: >60 mL/min (ref >60)
Glucose, Bld: 148 mg/dL — ABNORMAL HIGH (ref 70–99)
Potassium: 4.2 mmol/L (ref 3.5–5.1)
Sodium: 138 mmol/L (ref 135–145)

## 2020-03-14 LAB — TYPE AND SCREEN
ABO/RH(D): O POS
Antibody Screen: NEGATIVE

## 2020-03-14 LAB — PROTIME-INR
INR: 1 (ref 0.8–1.2)
Prothrombin Time: 13 seconds (ref 11.4–15.2)

## 2020-03-14 SURGERY — IR WITH ANESTHESIA
Anesthesia: General | Laterality: Right

## 2020-03-14 MED ORDER — SODIUM CHLORIDE 0.9 % IV SOLN
1.0000 g | Freq: Once | INTRAVENOUS | Status: AC
  Administered 2020-03-14: 1 g via INTRAVENOUS
  Filled 2020-03-14: qty 1

## 2020-03-14 MED ORDER — SODIUM CHLORIDE 0.9 % IV SOLN
INTRAVENOUS | Status: AC
  Filled 2020-03-14: qty 250

## 2020-03-14 MED ORDER — ONDANSETRON HCL 4 MG/2ML IJ SOLN
INTRAMUSCULAR | Status: DC | PRN
  Administered 2020-03-14: 4 mg via INTRAVENOUS

## 2020-03-14 MED ORDER — OXYCODONE HCL 5 MG PO TABS: 5.0000 mg | ORAL_TABLET | Freq: Once | ORAL | Status: DC | PRN

## 2020-03-14 MED ORDER — ALBUMIN HUMAN 5 % IV SOLN: INTRAVENOUS | Status: DC | PRN

## 2020-03-14 MED ORDER — SUGAMMADEX SODIUM 200 MG/2ML IV SOLN
INTRAVENOUS | Status: DC | PRN
  Administered 2020-03-14: 263 mg via INTRAVENOUS

## 2020-03-14 MED ORDER — AMISULPRIDE (ANTIEMETIC) 5 MG/2ML IV SOLN: 10.0000 mg | Freq: Once | INTRAVENOUS | Status: DC | PRN

## 2020-03-14 MED ORDER — DOCUSATE SODIUM 100 MG PO CAPS
100.0000 mg | ORAL_CAPSULE | Freq: Two times a day (BID) | ORAL | Status: DC
  Administered 2020-03-14 – 2020-03-15 (×3): 100 mg via ORAL
  Filled 2020-03-14 (×4): qty 1

## 2020-03-14 MED ORDER — ORAL CARE MOUTH RINSE: 15.0000 mL | Freq: Once | OROMUCOSAL | Status: AC

## 2020-03-14 MED ORDER — SODIUM CHLORIDE 0.9 % IV SOLN: INTRAVENOUS | Status: DC

## 2020-03-14 MED ORDER — PHENYLEPHRINE HCL-NACL 10-0.9 MG/250ML-% IV SOLN
INTRAVENOUS | Status: DC | PRN
  Administered 2020-03-14: 50 ug/min via INTRAVENOUS

## 2020-03-14 MED ORDER — LACTATED RINGERS IV SOLN: INTRAVENOUS | Status: DC

## 2020-03-14 MED ORDER — ROCURONIUM BROMIDE 10 MG/ML (PF) SYRINGE
PREFILLED_SYRINGE | INTRAVENOUS | Status: DC | PRN
  Administered 2020-03-14: 100 mg via INTRAVENOUS

## 2020-03-14 MED ORDER — HYDROMORPHONE HCL 1 MG/ML IJ SOLN: 0.2500 mg | INTRAMUSCULAR | Status: DC | PRN

## 2020-03-14 MED ORDER — EPHEDRINE SULFATE-NACL 50-0.9 MG/10ML-% IV SOSY
PREFILLED_SYRINGE | INTRAVENOUS | Status: DC | PRN
  Administered 2020-03-14 (×6): 10 mg via INTRAVENOUS

## 2020-03-14 MED ORDER — HYDROCODONE-ACETAMINOPHEN 5-325 MG PO TABS
1.0000 | ORAL_TABLET | ORAL | Status: DC | PRN
  Administered 2020-03-14: 1 via ORAL
  Filled 2020-03-14: qty 1

## 2020-03-14 MED ORDER — PROPOFOL 10 MG/ML IV BOLUS
INTRAVENOUS | Status: DC | PRN
  Administered 2020-03-14: 160 mg via INTRAVENOUS

## 2020-03-14 MED ORDER — CHLORHEXIDINE GLUCONATE 0.12 % MT SOLN
15.0000 mL | Freq: Once | OROMUCOSAL | Status: AC
  Administered 2020-03-14: 15 mL via OROMUCOSAL

## 2020-03-14 MED ORDER — FENTANYL CITRATE (PF) 250 MCG/5ML IJ SOLN
INTRAMUSCULAR | Status: AC
  Filled 2020-03-14: qty 5

## 2020-03-14 MED ORDER — LIDOCAINE 2% (20 MG/ML) 5 ML SYRINGE
INTRAMUSCULAR | Status: DC | PRN
  Administered 2020-03-14: 100 mg via INTRAVENOUS

## 2020-03-14 MED ORDER — VASOPRESSIN 20 UNIT/ML IV SOLN
INTRAVENOUS | Status: AC
  Filled 2020-03-14: qty 1

## 2020-03-14 MED ORDER — DEXAMETHASONE SODIUM PHOSPHATE 10 MG/ML IJ SOLN
INTRAMUSCULAR | Status: DC | PRN
  Administered 2020-03-14: 5 mg via INTRAVENOUS

## 2020-03-14 MED ORDER — SENNOSIDES-DOCUSATE SODIUM 8.6-50 MG PO TABS
1.0000 | ORAL_TABLET | Freq: Every day | ORAL | Status: DC | PRN
  Filled 2020-03-14: qty 1

## 2020-03-14 MED ORDER — PHENYLEPHRINE 40 MCG/ML (10ML) SYRINGE FOR IV PUSH (FOR BLOOD PRESSURE SUPPORT)
PREFILLED_SYRINGE | INTRAVENOUS | Status: DC | PRN
  Administered 2020-03-14 (×3): 120 ug via INTRAVENOUS

## 2020-03-14 MED ORDER — FENTANYL CITRATE (PF) 250 MCG/5ML IJ SOLN
INTRAMUSCULAR | Status: DC | PRN
Start: 2020-03-14 — End: 2020-03-14
  Administered 2020-03-14 (×3): 50 ug via INTRAVENOUS

## 2020-03-14 MED ORDER — SODIUM CHLORIDE 0.9 % IV SOLN
INTRAVENOUS | Status: AC
  Filled 2020-03-14: qty 20

## 2020-03-14 MED ORDER — ONDANSETRON HCL 4 MG/2ML IJ SOLN: 4.0000 mg | Freq: Four times a day (QID) | INTRAMUSCULAR | Status: DC | PRN

## 2020-03-14 MED ORDER — TAMSULOSIN HCL 0.4 MG PO CAPS
0.4000 mg | ORAL_CAPSULE | Freq: Every day | ORAL | Status: DC
  Administered 2020-03-14: 0.4 mg via ORAL
  Filled 2020-03-14: qty 1

## 2020-03-14 MED ORDER — OXYCODONE HCL 5 MG/5ML PO SOLN: 5.0000 mg | Freq: Once | ORAL | Status: DC | PRN

## 2020-03-14 MED ORDER — PROMETHAZINE HCL 25 MG/ML IJ SOLN: 6.2500 mg | INTRAMUSCULAR | Status: DC | PRN

## 2020-03-14 NOTE — Anesthesia Procedure Notes (Signed)
Procedure Name: Intubation Date/Time: 03/14/2020 12:06 PM Performed by: West Pugh, CRNA Pre-anesthesia Checklist: Patient identified, Emergency Drugs available, Suction available, Patient being monitored and Timeout performed Patient Re-evaluated:Patient Re-evaluated prior to induction Oxygen Delivery Method: Circle system utilized Preoxygenation: Pre-oxygenation with 100% oxygen Induction Type: IV induction Ventilation: Mask ventilation without difficulty Laryngoscope Size: Mac and 3 Grade View: Grade I Tube type: Oral Tube size: 8.0 mm Number of attempts: 1 Airway Equipment and Method: Stylet Placement Confirmation: ETT inserted through vocal cords under direct vision,  positive ETCO2 and breath sounds checked- equal and bilateral Secured at: 22 cm Tube secured with: Tape Dental Injury: Teeth and Oropharynx as per pre-operative assessment

## 2020-03-14 NOTE — Transfer of Care (Signed)
Immediate Anesthesia Transfer of Care Note  Patient: Blake Hurst  Procedure(s) Performed: RIGHT RENAL CYRO ABLATION (Right )  Patient Location: PACU  Anesthesia Type:General  Level of Consciousness: drowsy and patient cooperative  Airway & Oxygen Therapy: Patient Spontanous Breathing and Patient connected to face mask oxygen  Post-op Assessment: Report given to RN and Post -op Vital signs reviewed and stable  Post vital signs: Reviewed and stable  Last Vitals:  Vitals Value Taken Time  BP 120/73 03/14/20 1450  Temp    Pulse 67 03/14/20 1452  Resp 15 03/14/20 1452  SpO2 89 % 03/14/20 1452  Vitals shown include unvalidated device data.  Last Pain: There were no vitals filed for this visit.       Complications: No complications documented.

## 2020-03-14 NOTE — Procedures (Signed)
  Procedure: CT core biopsy and cryoablation RLP renal mass   EBL:   minimal Complications:  none immediate  See full dictation in [employer].  Dillard Cannon MD Main # [phone] Pager  [phone] Mobile [phone]

## 2020-03-14 NOTE — Progress Notes (Signed)
Attempting to contact Blake Brod, MD via page since [phone], unable to reach at this time.  I was able to speak with on call RN she said he was in a procedure at this time. Patients dinner time CBG was 181. Patient reports taking insulin only once a day in the AM at home. Would like to clarify with MD if he wants patient on a sliding scale since his CBG's are AC/HS or if he wants to resume his home routine. Awaiting response, will relay to the oncoming shift nurse.

## 2020-03-14 NOTE — Sedation Documentation (Signed)
Anesthesia in to sedate and monitor. 

## 2020-03-14 NOTE — Progress Notes (Signed)
Patient arrived to floor from interventional radiology.

## 2020-03-14 NOTE — Anesthesia Postprocedure Evaluation (Signed)
Anesthesia Post Note  Patient: Blake Hurst  Procedure(s) Performed: RIGHT RENAL CYRO ABLATION (Right )     Patient location during evaluation: PACU Anesthesia Type: General Level of consciousness: awake and alert Pain management: pain level controlled Vital Signs Assessment: post-procedure vital signs reviewed and stable Respiratory status: spontaneous breathing, nonlabored ventilation and respiratory function stable Cardiovascular status: blood pressure returned to baseline and stable Postop Assessment: no apparent nausea or vomiting Anesthetic complications: no   No complications documented.  Last Vitals:  Vitals:   03/14/20 1530 03/14/20 1550  BP: (!) 121/58 (!) 131/96  Pulse: 63 66  Resp: 12 20  Temp: (!) 36.4 C 36.4 C  SpO2: 94% 96%    Last Pain:  Vitals:   03/14/20 1550  TempSrc: Oral  PainSc:                  Lynda Rainwater

## 2020-03-14 NOTE — Progress Notes (Signed)
Patient assessed at the bedside post-procedure/post-recovery. He is awake and alert, his son is at the bedside. He denies any significant pain or discomfort. He is hungry and has just ordered food. The procedure site (right mid back) is clean and dry, no tenderness to palpation.   IR will plan to discharge patient tomorrow.  Please call IR with any questions.  Soyla Dryer, [place] [phone] 03/14/2020, 4:47 PM
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
## 2020-03-15 ENCOUNTER — Encounter (HOSPITAL_COMMUNITY): Payer: Self-pay | Admitting: Interventional Radiology

## 2020-03-15 ENCOUNTER — Other Ambulatory Visit: Payer: Self-pay | Admitting: Cardiology

## 2020-03-15 DIAGNOSIS — Z6838 Body mass index (BMI) 38.0-38.9, adult: Secondary | ICD-10-CM | POA: Diagnosis not present

## 2020-03-15 DIAGNOSIS — C641 Malignant neoplasm of right kidney, except renal pelvis: Secondary | ICD-10-CM | POA: Diagnosis not present

## 2020-03-15 DIAGNOSIS — R600 Localized edema: Secondary | ICD-10-CM

## 2020-03-15 DIAGNOSIS — Z87891 Personal history of nicotine dependence: Secondary | ICD-10-CM | POA: Diagnosis not present

## 2020-03-15 DIAGNOSIS — Z9889 Other specified postprocedural states: Secondary | ICD-10-CM | POA: Diagnosis not present

## 2020-03-15 DIAGNOSIS — E669 Obesity, unspecified: Secondary | ICD-10-CM | POA: Diagnosis not present

## 2020-03-15 LAB — GLUCOSE, CAPILLARY
Glucose-Capillary: 330 mg/dL — ABNORMAL HIGH (ref 70–99)
Glucose-Capillary: 273 mg/dL — ABNORMAL HIGH (ref 70–99)
Glucose-Capillary: 237 mg/dL — ABNORMAL HIGH (ref 70–99)
Glucose-Capillary: 302 mg/dL — ABNORMAL HIGH (ref 70–99)

## 2020-03-15 LAB — SURGICAL PATHOLOGY

## 2020-03-15 MED ORDER — INSULIN ASPART 100 UNIT/ML ~~LOC~~ SOLN
0.0000 [IU] | Freq: Three times a day (TID) | SUBCUTANEOUS | Status: DC
  Administered 2020-03-15: 5 [IU] via SUBCUTANEOUS

## 2020-03-15 MED ORDER — INSULIN ASPART 100 UNIT/ML ~~LOC~~ SOLN: 0.0000 [IU] | Freq: Every day | SUBCUTANEOUS | Status: DC

## 2020-03-15 MED ORDER — INSULIN ASPART 100 UNIT/ML ~~LOC~~ SOLN
7.0000 [IU] | SUBCUTANEOUS | Status: AC
  Administered 2020-03-15: 7 [IU] via SUBCUTANEOUS

## 2020-03-15 NOTE — Discharge Summary (Signed)
Patient ID: Blake Hurst MRN: [id number] DOB/AGE: 1940-11-15 79 y.o.  Admit date: 03/14/2020 Discharge date: 03/15/2020  Supervising Physician: Jacqulynn Cadet  Patient Status: [facility] - In-pt  Admission Diagnoses: Renal cancer, right  Discharge Diagnoses:  Active Problems:   Renal cancer, right [facility])   Discharged Condition: stable  Hospital Course:  Patient [facility]14/2020 for an image-guided right renal mass biopsy and cryoablation with Dr. Vernard Gambles. Procedure occurred without major complications and patient was transferred to floor in stable condition (VSS, right flank incision stable) for overnight observation. Foley was removed last evening without difficulties. No major events occurred overnight.  Patient awake and alert sitting in bed with no complaints at this time. Denies hematuria, flank pain, or N/V. Right flank puncture sites stable. Plan to discharge home today and follow-up with Dr. Vernard Gambles for televisit 4 weeks after discharge.   Consults: None  Significant Diagnostic Studies: CT GUIDE TISSUE ABLATION  Result Date: 03/14/2020 INDICATION: Exophytic right lower pole renal mass suspected renal cell carcinoma. EXAM: CT-GUIDED PERCUTANEOUS CRYOABLATION OF RIGHT RENAL MASS CT-GUIDED CORE BIOPSY, RIGHT RENAL MASS ANESTHESIA/SEDATION: General MEDICATIONS: 1 gram IV ceftriaxone. The antibiotic was administered in an appropriate time interval prior to needle puncture of the skin. CONTRAST:  None administered PROCEDURE: The procedure, risks, benefits, and alternatives were explained to the patient. Questions regarding the procedure were encouraged and answered. The patient understands and consents to the procedure. The patient was placed under general anesthesia. Initial unenhanced CT was performed in a prone position to localize the right lower pole mass, correlating with recent contrast enhanced CT. The patient was prepped with chlorhexidine in a sterile  fashion, and a sterile drape was applied covering the operative field. A sterile gown and sterile gloves were used for the procedure. Under CT guidance, 3 percutaneous cryoablation probes sequentially advanced into the right lower pole renal mass. Probe positioning was confirmed by CT prior to cryoablation. Subsequently, a 60 gauge trocar needle was advanced to the margin of the lesion and coaxial 18 gauge core biopsy samples obtained and submitted in formalin to surgical pathology. Cryoablation was performed through the 3 cryoablation probes. Initial 10 minute cycle of cryoablation was performed. CT confirmed good ice ball formation and morphology. This was followed by a 8 minute thaw cycle. A second 10 minute cycle of cryoablation was then performed. After active thaw, the cryoablation probes were removed. Post-procedural CT was performed. COMPLICATIONS: None immediate. FINDINGS: Right lower pole renal mass was localized. No significant change from previous contrast CT 01/11/2020. Technically successful percutaneous cryoablation in core biopsy upper lesion as above. IMPRESSION: 1. Technically successful CT-guided core biopsy, right lower pole renal mass. 2. Technically successful CT-guided cryoablation of right lower pole renal lesion. PLAN: Extended overnight observation. Initial clinical follow-up will be performed in approximately 4 weeks. Follow-up imaging in 3-4 months and subsequently as dictated by surgical pathology findings. Electronically Signed   By: Lucrezia Europe M.D.   On: 03/14/2020 16:01   CT BIOPSY  Result Date: 03/14/2020 INDICATION: Exophytic right lower pole renal mass suspected renal cell carcinoma. EXAM: CT-GUIDED PERCUTANEOUS CRYOABLATION OF RIGHT RENAL MASS CT-GUIDED CORE BIOPSY, RIGHT RENAL MASS ANESTHESIA/SEDATION: General MEDICATIONS: 1 gram IV ceftriaxone. The antibiotic was administered in an appropriate time interval prior to needle puncture of the skin. CONTRAST:  None administered  PROCEDURE: The procedure, risks, benefits, and alternatives were explained to the patient. Questions regarding the procedure were encouraged and answered. The patient understands and consents to the procedure. The patient was  placed under general anesthesia. Initial unenhanced CT was performed in a prone position to localize the right lower pole mass, correlating with recent contrast enhanced CT. The patient was prepped with chlorhexidine in a sterile fashion, and a sterile drape was applied covering the operative field. A sterile gown and sterile gloves were used for the procedure. Under CT guidance, 3 percutaneous cryoablation probes sequentially advanced into the right lower pole renal mass. Probe positioning was confirmed by CT prior to cryoablation. Subsequently, a 40 gauge trocar needle was advanced to the margin of the lesion and coaxial 18 gauge core biopsy samples obtained and submitted in formalin to surgical pathology. Cryoablation was performed through the 3 cryoablation probes. Initial 10 minute cycle of cryoablation was performed. CT confirmed good ice ball formation and morphology. This was followed by a 8 minute thaw cycle. A second 10 minute cycle of cryoablation was then performed. After active thaw, the cryoablation probes were removed. Post-procedural CT was performed. COMPLICATIONS: None immediate. FINDINGS: Right lower pole renal mass was localized. No significant change from previous contrast CT 01/11/2020. Technically successful percutaneous cryoablation in core biopsy upper lesion as above. IMPRESSION: 1. Technically successful CT-guided core biopsy, right lower pole renal mass. 2. Technically successful CT-guided cryoablation of right lower pole renal lesion. PLAN: Extended overnight observation. Initial clinical follow-up will be performed in approximately 4 weeks. Follow-up imaging in 3-4 months and subsequently as dictated by surgical pathology findings. Electronically Signed   By: Lucrezia Europe M.D.   On: 03/14/2020 16:01   IR Radiologist Eval & Mgmt  Result Date: 02/16/2020 Please refer to notes tab for details about interventional procedure. (Op Note)   Treatments: Image-guided right renal mass biopsy and cryoablation  Discharge Exam: Blood pressure (!) 122/59, pulse 76, temperature 97.6 F (36.4 C), temperature source Oral, resp. rate 18, height 6\' 1"  (1.854 m), weight 295 lb (133.8 kg), SpO2 92 %. Physical Exam Vitals and nursing note reviewed.  Constitutional:      General: He is not in acute distress.    Appearance: Normal appearance.  Cardiovascular:     Rate and Rhythm: Normal rate and regular rhythm.     Heart sounds: Normal heart sounds. No murmur heard.   Pulmonary:     Effort: Pulmonary effort is normal. No respiratory distress.     Breath sounds: Normal breath sounds. No wheezing.  Skin:    General: Skin is warm and dry.     Comments: Right flank puncture sites soft without active bleeding or hematoma.  Neurological:     Mental Status: He is alert and oriented to person, place, and time.     Disposition: Discharge disposition: 01-Home or Self Care       Discharge Instructions    Call MD for:  difficulty breathing, headache or visual disturbances   Complete by: As directed    Call MD for:  extreme fatigue   Complete by: As directed    Call MD for:  hives   Complete by: As directed    Call MD for:  persistant dizziness or light-headedness   Complete by: As directed    Call MD for:  persistant nausea and vomiting   Complete by: As directed    Call MD for:  redness, tenderness, or signs of infection (pain, swelling, redness, odor or green/yellow discharge around incision site)   Complete by: As directed    Call MD for:  severe uncontrolled pain   Complete by: As directed    Call  MD for:  temperature >100.4   Complete by: As directed    Diet - low sodium heart healthy   Complete by: As directed    Discharge instructions   Complete  by: As directed    No lifting more than 10 pounds for 1 week. Ok to shower 24 hours post-procedure. Recommend showering with bandage on, remove immediately after showering and pat area dry. No further dressing changes needed after this- ensure area remains clean and dry until fully healed. No submerging (swimming, bathing) for 7 days post-procedure.   Discharge wound care:   Complete by: As directed    Ok to remove dressing 24 hours after procedure following first shower. No further dressing changes needed after this- ensure area remains clean and dry until fully healed.   Increase activity slowly   Complete by: As directed      Allergies as of 03/15/2020      Reactions   Niacin And Related Other (See Comments)   headaches      Medication List    TAKE these medications   amLODipine 5 MG tablet Commonly known as: NORVASC TAKE 1 TABLET BY MOUTH EVERY DAY   atorvastatin 80 MG tablet Commonly known as: LIPITOR Take 80 mg by mouth daily at 6 PM. One tablet daily   clopidogrel 75 MG tablet Commonly known as: PLAVIX Take 75 mg by mouth daily.   isosorbide mononitrate 30 MG 24 hr tablet Commonly known as: IMDUR TAKE 1 TABLET BY MOUTH EVERY DAY   Levemir 100 UNIT/ML injection Generic drug: insulin detemir Inject 66 Units into the skin in the morning.   lisinopril 10 MG tablet Commonly known as: ZESTRIL Take 10 mg by mouth daily.   Lyrica 75 MG capsule Generic drug: pregabalin Take 75 mg by mouth every evening.   metFORMIN 1000 MG tablet Commonly known as: GLUCOPHAGE Take 1,000 mg by mouth 2 (two) times daily.   metoprolol tartrate 50 MG tablet Commonly known as: LOPRESSOR Take 50 mg by mouth 2 (two) times daily.   nitroGLYCERIN 0.4 MG SL tablet Commonly known as: NITROSTAT Place 1 tablet (0.4 mg total) under the tongue every 5 (five) minutes as needed for chest pain.   NovoTwist 30G X 8 MM Misc Generic drug: Insulin Pen Needle One needle daily   BD Pen Needle  Nano 2nd Gen 32G X 4 MM Misc Generic drug: Insulin Pen Needle daily.   omeprazole 20 MG capsule Commonly known as: PRILOSEC Take 20 mg by mouth daily.   OneTouch Verio test strip Generic drug: glucose blood USE TO TEST BLOOD SUGAR 2-3 TIMES DAILY   tamsulosin 0.4 MG Caps capsule Commonly known as: FLOMAX Take 0.4 mg by mouth at bedtime.   torsemide 20 MG tablet Commonly known as: DEMADEX Take 1 tablet (20 mg total) by mouth 2 (two) times daily. What changed: when to take this   traMADol 50 MG tablet Commonly known as: ULTRAM Take 1 tablet (50 mg total) by mouth every 6 (six) hours as needed.   Victoza 18 MG/3ML Sopn Generic drug: liraglutide Inject 1.8 mg into the skin in the morning.            Discharge Care Instructions  (From admission, onward)         Start     Ordered   03/15/20 0000  Discharge wound care:       Comments: Ok to remove dressing 24 hours after procedure following first shower. No further dressing changes needed after  this- ensure area remains clean and dry until fully healed.   03/15/20 1152          Follow-up Information    Arne Cleveland, MD Follow up in 4 week(s).   Specialties: Interventional Radiology, Radiology Why: Please follow-up with Dr. Vernard Gambles for televisit 4 weeks after discharge. Our office will call you to set up this procedure. Contact information: [address] [place] [address] [phone]                Electronically Signed: Earley Abide, PA-C 03/15/2020, 11:56 AM   I have spent Less Than 30 Minutes discharging Delena Bali.

## 2020-03-15 NOTE — Discharge Instructions (Signed)
Cryoablation Ablation of Renal Tumors, Care After This sheet gives you information about how to care for yourself after your procedure. Your health care provider may also give you more specific instructions. If you have problems or questions, contact your health care provider. What can I expect after the procedure? After your procedure, it is common to have pain and discomfort in the upper abdomen. You will be given pain medicines to control this. Follow these instructions at home: Incision care  Follow instructions from your health care provider about how to take care of your incision. Make sure you: ? Wash your hands with soap and water before you change your bandage (dressing). If soap and water are not available, use hand sanitizer. ? Ok to remove dressing following first shower. No further dressing changes needed after this- ensure area remains clean and dry until fully healed.  Check your incision area every day for signs of infection. Check for: ? Redness, swelling, or pain. ? Fluid or blood. ? Warmth. ? Pus or a bad smell. Activity  Rest as often as needing during the first few days of recovery.  Do not lift anything that is heavier than 10 lbs (4.5 kg) for 1 week. General instructions  Do not take baths, swim, or use a hot tub u for 7 days post-procedure.  Ok to shower 24 hours post-procedure.  Follow any special diet instructions as directed by your health care provider.  To prevent or treat constipation while you are taking prescription pain medicine, your health care provider may recommend that you: ? Drink enough fluid to keep your urine clear or pale yellow. ? Take over-the-counter or prescription medicines. ? Eat foods that are high in fiber, such as fresh fruits and vegetables, whole grains, and beans. ? Limit foods that are high in fat and processed sugars, such as fried and sweet foods.  Do not use any products that contain nicotine or tobacco, such as cigarettes  and e-cigarettes. If you need help quitting, ask your health care provider.  Keep all follow-up visits as told by your health care provider. This is important. 4 week televisit. Contact a health care provider if:  You cannot pass gas.  You are unable to have a bowel movement within 2 days.  You have a skin rash. Get help right away if:  You have a fever.  You have severe or lasting pain in your abdomen, shoulder, or back.  You have trouble swallowing or breathing.  You have severe weakness or dizziness.  You have chest pain or shortness of breath.  This information is not intended to replace advice given to you by your health care provider. Make sure you discuss any questions you have with your health care provider. Document Revised: 04/17/2017 Document Reviewed: 07/24/2016 Elsevier Patient Education  [place].

## 2020-03-15 NOTE — Progress Notes (Signed)
Inpatient Diabetes Program Recommendations  AACE/ADA: New Consensus Statement on Inpatient Glycemic Control (2015)  Target Ranges:  Prepandial:   less than 140 mg/dL      Peak postprandial:   less than 180 mg/dL (1-2 hours)      Critically ill patients:  140 - 180 mg/dL   Lab Results  Component Value Date   GLUCAP 237 (H) 03/15/2020   HGBA1C 6.7 (H) 03/05/2020    Review of Glycemic Control Results for [name], Blake Hurst (MRN [id number]) as of 03/15/2020 10:48  Ref. Range 03/14/2020 17:16 03/14/2020 21:55 03/15/2020 01:29 03/15/2020 03:56 03/15/2020 07:30  Glucose-Capillary Latest Ref Range: 70 - 99 mg/dL 181 (H) 285 (H) 330 (H) 273 (H) 237 (H)   Diabetes history: DM 2 Outpatient Diabetes medications: Levemir 66 units Daily, Metformin 1000 mg bid, Victoza 1.8 mg Daily Current orders for Inpatient glycemic control:  Novolog 0-9 units tid + hs scale  A1c 6.7% on 10/18  Inpatient Diabetes Program Recommendations:     Note decadron 5 mg given yesterday. Pt may d/c today.   -  Add Levemir 15 units  Thanks,  Tama Headings RN, MSN, BC-ADM Inpatient Diabetes Coordinator Team Pager [phone] (8a-5p)

## 2020-03-18 DIAGNOSIS — I5033 Acute on chronic diastolic (congestive) heart failure: Secondary | ICD-10-CM | POA: Diagnosis not present

## 2020-03-21 ENCOUNTER — Other Ambulatory Visit: Payer: Self-pay

## 2020-03-21 ENCOUNTER — Ambulatory Visit (INDEPENDENT_AMBULATORY_CARE_PROVIDER_SITE_OTHER): Payer: Medicare Other | Admitting: Urology

## 2020-03-21 ENCOUNTER — Encounter: Payer: Self-pay | Admitting: Urology

## 2020-03-21 VITALS — BP 117/63 | HR 82 | Temp 99.0°F | Ht 73.0 in | Wt 295.0 lb

## 2020-03-21 DIAGNOSIS — I25708 Atherosclerosis of coronary artery bypass graft(s), unspecified, with other forms of angina pectoris: Secondary | ICD-10-CM

## 2020-03-21 DIAGNOSIS — N2889 Other specified disorders of kidney and ureter: Secondary | ICD-10-CM

## 2020-03-21 NOTE — Progress Notes (Signed)
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
## 2020-03-21 NOTE — Patient Instructions (Signed)
Renal Mass  A renal mass is a growth in the kidney. A renal mass may be found while performing an MRI, CT scan, or ultrasound for other problems of the abdomen. Certain types of cancers, infections, or injuries can cause a renal mass. A renal mass that is cancerous (malignant) may grow or spread quickly. Others are harmless (benign). What are common types of renal masses? Renal masses include:  Tumors. These may be cancerous (malignant) or noncancerous (benign). ? The most common type of kidney cancer is renal cell carcinoma. ? The most common benign tumors of the kidney include renal adenomas, oncocytomas, and angiomyolipoma (AML).  Cysts. These are fluid-filled sacs that form on or in the kidney. ? It is not always known what causes a cyst to develop in or on the kidney. ? Most kidney cysts do not cause symptoms and do not need to be treated. What type of testing might I need? Your health care provider may recommend that you have tests to diagnose the cause of your renal mass. The following tests may be done if a renal mass is found:  Physical exam.  Blood tests.  Urine tests.  Imaging tests, such as ultrasound, CT scan, or MRI.  Biopsy. This is a small sample that is removed from the renal mass and tested in a lab. The exact tests and how often they are done will depend on:  The size and appearance of the renal mass.  Risk factors or medical conditions that increase your risk for problems.  Any symptoms associated with the renal mass, or concerns that you have about it. Tests and physical exams may be done once, or they may be done regularly for a period of time. Tests and exams that are done regularly will help monitor whether the mass is growing and beginning to cause problems. What are common treatments for renal masses? Treatment is not always needed for this condition. Your health care provider may recommend careful monitoring (watchful waiting) and regular tests and exams.  Treatment will depend on the cause of the mass. Follow these instructions at home: What you need to do at home will depend on the cause of the mass. Follow the instructions that your health care provider gives to you. In general:  Take over-the-counter and prescription medicines only as told by your health care provider.  If you are prescribed an antibiotic medicine, take it as told by your health care provider. Do not stop taking the antibiotic even if you start to feel better.  Follow any restrictions that are given to you by your health care provider.  Keep all follow-up visits as told by your health care provider. This is important. ? You may need to see your health care provider once or twice a year to have CT scans and ultrasounds done. These tests will show if your renal mass has changed or grown bigger. Contact a health care provider if you:  Have pain in the side or back (flank pain).  Have a fever.  Feel full soon after eating.  Have pain or swelling in the abdomen.  Lose weight. Get help right away if:  Your pain gets worse.  There is blood in your urine.  You cannot urinate.  You have chest pain.  You have trouble breathing. Summary  A renal mass is a growth in the kidney. It may be cancerous (malignant) and grow or spread quickly, or it may be harmless (benign).  Renal masses may be found while performing   an MRI, CT scan, or ultrasound for other problems of the abdomen.  Your health care provider may recommend that you have tests to diagnose the cause of your renal mass. This may include a physical exam, blood tests, urine tests, imaging, or a biopsy.  Treatment is not always needed for this condition. Careful monitoring (watchful waiting) may be recommended. This information is not intended to replace advice given to you by your health care provider. Make sure you discuss any questions you have with your health care provider. Document Revised: 06/11/2017  Document Reviewed: 06/11/2017 Elsevier Patient Education  2020 Elsevier Inc.  

## 2020-03-22 ENCOUNTER — Ambulatory Visit: Payer: Medicare Other | Admitting: Urology

## 2020-03-28 ENCOUNTER — Other Ambulatory Visit: Payer: Self-pay

## 2020-03-28 ENCOUNTER — Encounter: Payer: Self-pay | Admitting: *Deleted

## 2020-03-28 ENCOUNTER — Ambulatory Visit
Admission: RE | Admit: 2020-03-28 | Discharge: 2020-03-28 | Disposition: A | Discharge status: Home / Self Care | Payer: Medicare Other | Source: Ambulatory Visit | Attending: Student | Admitting: Student

## 2020-03-28 DIAGNOSIS — N2889 Other specified disorders of kidney and ureter: Secondary | ICD-10-CM

## 2020-03-28 HISTORY — PX: IR RADIOLOGIST EVAL & MGMT: IMG5224
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
## 2020-04-02 DIAGNOSIS — N1832 Chronic kidney disease, stage 3b: Secondary | ICD-10-CM | POA: Diagnosis not present

## 2020-04-05 ENCOUNTER — Other Ambulatory Visit: Payer: Self-pay

## 2020-04-05 ENCOUNTER — Ambulatory Visit (HOSPITAL_COMMUNITY)
Admission: RE | Admit: 2020-04-05 | Discharge: 2020-04-05 | Disposition: A | Discharge status: Home / Self Care | Payer: Medicare Other | Source: Ambulatory Visit | Attending: Physician Assistant | Admitting: Physician Assistant

## 2020-04-05 ENCOUNTER — Other Ambulatory Visit: Payer: Self-pay | Admitting: Physician Assistant

## 2020-04-05 ENCOUNTER — Other Ambulatory Visit (HOSPITAL_COMMUNITY): Payer: Self-pay | Admitting: Physician Assistant

## 2020-04-05 DIAGNOSIS — R600 Localized edema: Secondary | ICD-10-CM | POA: Diagnosis not present

## 2020-04-05 DIAGNOSIS — I110 Hypertensive heart disease with heart failure: Secondary | ICD-10-CM | POA: Diagnosis not present

## 2020-04-05 DIAGNOSIS — M545 Low back pain, unspecified: Secondary | ICD-10-CM | POA: Diagnosis not present

## 2020-04-05 DIAGNOSIS — M79662 Pain in left lower leg: Secondary | ICD-10-CM | POA: Diagnosis not present

## 2020-04-05 DIAGNOSIS — C641 Malignant neoplasm of right kidney, except renal pelvis: Secondary | ICD-10-CM | POA: Diagnosis not present

## 2020-04-05 DIAGNOSIS — G8929 Other chronic pain: Secondary | ICD-10-CM | POA: Diagnosis not present

## 2020-04-05 DIAGNOSIS — M7989 Other specified soft tissue disorders: Secondary | ICD-10-CM | POA: Diagnosis not present

## 2020-04-05 DIAGNOSIS — E1140 Type 2 diabetes mellitus with diabetic neuropathy, unspecified: Secondary | ICD-10-CM | POA: Diagnosis not present

## 2020-04-05 DIAGNOSIS — E785 Hyperlipidemia, unspecified: Secondary | ICD-10-CM | POA: Diagnosis not present

## 2020-04-05 DIAGNOSIS — N401 Enlarged prostate with lower urinary tract symptoms: Secondary | ICD-10-CM | POA: Diagnosis not present

## 2020-04-05 DIAGNOSIS — I5032 Chronic diastolic (congestive) heart failure: Secondary | ICD-10-CM | POA: Diagnosis not present

## 2020-04-05 DIAGNOSIS — I25119 Atherosclerotic heart disease of native coronary artery with unspecified angina pectoris: Secondary | ICD-10-CM | POA: Diagnosis not present

## 2020-04-08 DIAGNOSIS — G8929 Other chronic pain: Secondary | ICD-10-CM | POA: Insufficient documentation

## 2020-04-09 DIAGNOSIS — I129 Hypertensive chronic kidney disease with stage 1 through stage 4 chronic kidney disease, or unspecified chronic kidney disease: Secondary | ICD-10-CM | POA: Diagnosis not present

## 2020-04-09 DIAGNOSIS — N1832 Chronic kidney disease, stage 3b: Secondary | ICD-10-CM | POA: Diagnosis not present

## 2020-04-09 DIAGNOSIS — E1122 Type 2 diabetes mellitus with diabetic chronic kidney disease: Secondary | ICD-10-CM | POA: Diagnosis not present

## 2020-04-17 DIAGNOSIS — I5033 Acute on chronic diastolic (congestive) heart failure: Secondary | ICD-10-CM | POA: Diagnosis not present

## 2020-05-18 DIAGNOSIS — I5033 Acute on chronic diastolic (congestive) heart failure: Secondary | ICD-10-CM | POA: Diagnosis not present

## 2020-05-19 HISTORY — PX: RENAL BIOPSY: SHX156

## 2020-06-12 ENCOUNTER — Other Ambulatory Visit: Payer: Self-pay

## 2020-06-12 ENCOUNTER — Ambulatory Visit (HOSPITAL_COMMUNITY)
Admission: RE | Admit: 2020-06-12 | Discharge: 2020-06-12 | Disposition: A | Discharge status: Home / Self Care | Payer: Medicare Other | Source: Ambulatory Visit | Attending: Urology | Admitting: Urology

## 2020-06-12 DIAGNOSIS — N2889 Other specified disorders of kidney and ureter: Secondary | ICD-10-CM | POA: Insufficient documentation

## 2020-06-12 LAB — POCT I-STAT CREATININE: Creatinine, Ser: 1.4 mg/dL — ABNORMAL HIGH (ref 0.61–1.24)

## 2020-06-12 MED ORDER — IOHEXOL 300 MG/ML  SOLN
100.0000 mL | Freq: Once | INTRAMUSCULAR | Status: AC | PRN
  Administered 2020-06-12: 100 mL via INTRAVENOUS

## 2020-06-12 NOTE — Progress Notes (Signed)
Results sent via my chart 

## 2020-06-19 DIAGNOSIS — C801 Malignant (primary) neoplasm, unspecified: Secondary | ICD-10-CM

## 2020-06-19 HISTORY — DX: Malignant (primary) neoplasm, unspecified: C80.1

## 2020-06-21 ENCOUNTER — Ambulatory Visit: Payer: Medicare Other | Admitting: Urology

## 2020-06-27 ENCOUNTER — Ambulatory Visit (INDEPENDENT_AMBULATORY_CARE_PROVIDER_SITE_OTHER): Payer: Medicare Other | Admitting: Urology

## 2020-06-27 ENCOUNTER — Encounter: Payer: Self-pay | Admitting: Urology

## 2020-06-27 ENCOUNTER — Other Ambulatory Visit: Payer: Self-pay

## 2020-06-27 VITALS — BP 124/67 | HR 78 | Temp 97.8°F | Ht 73.0 in | Wt 290.0 lb

## 2020-06-27 DIAGNOSIS — N2889 Other specified disorders of kidney and ureter: Secondary | ICD-10-CM

## 2020-06-27 DIAGNOSIS — N138 Other obstructive and reflux uropathy: Secondary | ICD-10-CM | POA: Diagnosis not present

## 2020-06-27 DIAGNOSIS — R3912 Poor urinary stream: Secondary | ICD-10-CM | POA: Diagnosis not present

## 2020-06-27 DIAGNOSIS — N401 Enlarged prostate with lower urinary tract symptoms: Secondary | ICD-10-CM

## 2020-06-27 LAB — URINALYSIS, ROUTINE W REFLEX MICROSCOPIC
Bilirubin, UA: NEGATIVE
Ketones, UA: NEGATIVE
Leukocytes,UA: NEGATIVE
Nitrite, UA: NEGATIVE
Protein,UA: NEGATIVE
RBC, UA: NEGATIVE
Specific Gravity, UA: 1.015 (ref 1.005–1.030)
Urobilinogen, Ur: 1 mg/dL (ref 0.2–1.0)
pH, UA: 7.5 (ref 5.0–7.5)

## 2020-06-27 LAB — BLADDER SCAN AMB NON-IMAGING: Scan Result: >907

## 2020-06-27 MED ORDER — TAMSULOSIN HCL 0.4 MG PO CAPS
0.4000 mg | ORAL_CAPSULE | Freq: Two times a day (BID) | ORAL | 11 refills | Status: DC
Start: 2020-06-27 — End: 2020-07-13

## 2020-06-27 NOTE — Addendum Note (Signed)
Addended byIris Pert on: 06/27/2020 09:19 AM   Modules accepted: Orders

## 2020-06-27 NOTE — Patient Instructions (Signed)
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
## 2020-06-27 NOTE — Progress Notes (Addendum)
Bladder Scan Patient can void: >907 ml Performed By: Durenda Guthrie, lpn   Urological Symptom Review  Patient is experiencing the following symptoms: Frequent urination Get up at night to urinate Leakage of urine Stream starts and stops Erection problems (male only)   Review of Systems  Gastrointestinal (upper)  : Negative for upper GI symptoms  Gastrointestinal (lower) : Negative for lower GI symptoms  Constitutional : Negative for symptoms  Skin: Itching  Eyes: Blurred vision  Ear/Nose/Throat : Negative for Ear/Nose/Throat symptoms  Hematologic/Lymphatic: Negative for Hematologic/Lymphatic symptoms  Cardiovascular : Leg swelling  Respiratory : Negative for respiratory symptoms  Endocrine: Excessive thirst  Musculoskeletal: Back pain Joint pain  Neurological: Negative for neurological symptoms  Psychologic: Negative for psychiatric symptoms

## 2020-07-05 ENCOUNTER — Other Ambulatory Visit: Payer: Self-pay | Admitting: Interventional Radiology

## 2020-07-05 DIAGNOSIS — N2889 Other specified disorders of kidney and ureter: Secondary | ICD-10-CM

## 2020-07-13 ENCOUNTER — Other Ambulatory Visit: Payer: Self-pay

## 2020-07-13 ENCOUNTER — Ambulatory Visit (INDEPENDENT_AMBULATORY_CARE_PROVIDER_SITE_OTHER): Payer: Medicare Other

## 2020-07-13 DIAGNOSIS — N138 Other obstructive and reflux uropathy: Secondary | ICD-10-CM

## 2020-07-13 DIAGNOSIS — N401 Enlarged prostate with lower urinary tract symptoms: Secondary | ICD-10-CM

## 2020-07-13 MED ORDER — SILODOSIN 8 MG PO CAPS: 8.0000 mg | ORAL_CAPSULE | Freq: Every day | ORAL | 1 refills | Status: DC

## 2020-07-13 MED ORDER — SILODOSIN 8 MG PO CAPS: 8.0000 mg | ORAL_CAPSULE | Freq: Every day | ORAL | 0 refills | Status: DC

## 2020-07-13 NOTE — Progress Notes (Signed)
Bladder Scan Pre void- 627 ml Post void- 370ml  Uroflow  Peak Flow: 50ml Average Flow: 55ml Voided Volume: 152ml Voiding Time: 15sec Flow Time: 15sec Time to Peak Flow: 3sec  PVR Volume: 344ml   I spoke with Dr. Alyson Ingles of patient bladder scan/uroflow results. Patient currently on flomax BID- new order to dc flomax and send in prescription for rapaflo 8 mg daily and return in 1 month PVR/MD f/u  Reviewed with patient.

## 2020-07-13 NOTE — Patient Instructions (Signed)
You will discontinue your flomax. We have sent a new prescription to your pharmacy for a medication called Rapaflo 8mg . Take this daily. Dr. Alyson Ingles will see you back in office in 1 month to see if your symptoms have improved.

## 2020-07-15 ENCOUNTER — Other Ambulatory Visit: Payer: Self-pay | Admitting: Cardiology

## 2020-07-15 DIAGNOSIS — I10 Essential (primary) hypertension: Secondary | ICD-10-CM

## 2020-07-26 ENCOUNTER — Other Ambulatory Visit: Payer: Self-pay | Admitting: Interventional Radiology

## 2020-08-12 ENCOUNTER — Other Ambulatory Visit: Payer: Self-pay | Admitting: Cardiology

## 2020-08-12 DIAGNOSIS — R600 Localized edema: Secondary | ICD-10-CM

## 2020-08-15 NOTE — Progress Notes (Signed)
Follow up visit  Subjective:   Blake Hurst, male    DOB: [birth year], 80 y.o.   MRN: [id number]  HPI   Chief Complaint  Patient presents with  . Coronary Artery Disease  . Follow-up    80 y.o. Caucasian male with hypertension, type 2 diabetes mellitus, hyperlipidemia, morbid obesity, coronary artery disease CABG 1996, redo CABG 2012, with patent LIMA-LAD, right radial-OM, occluded SVG-RCA and SVG-diagonal, with severe native vessel disease, renal cell carcinoma.  He was recently diagnosed with renal cell carcinoma, underwent renal cryoablation. He is now undergoing radiation. His lisinopril was stopped due to Cr elevation. Blood pressure is well controlled. He denies chest pain, overt shorntess of breath. He walks about 4000 steps every day.   He has noticed worsening leg swelling. On further questioning, patient tells me that he has neuropathy, but ha snpt seen a neurologist before. He says he "takes care of his callouses", but has not seen podiatry.   Current Outpatient Medications on File Prior to Visit  Medication Sig Dispense Refill  . amLODipine (NORVASC) 5 MG tablet TAKE 1 TABLET BY MOUTH EVERY DAY 90 tablet 1  . atorvastatin (LIPITOR) 80 MG tablet Take 80 mg by mouth daily at 6 PM. One tablet daily    . BD PEN NEEDLE NANO 2ND GEN 32G X 4 MM MISC daily.    . clopidogrel (PLAVIX) 75 MG tablet Take 75 mg by mouth daily.     Marland Kitchen glucose blood (ONETOUCH VERIO) test strip USE TO TEST BLOOD SUGAR 2-3 TIMES DAILY    . insulin detemir (LEVEMIR) 100 UNIT/ML injection Inject 66 Units into the skin in the morning.     . isosorbide mononitrate (IMDUR) 30 MG 24 hr tablet TAKE 1 TABLET BY MOUTH EVERY DAY (Patient taking differently: Take 30 mg by mouth daily.) 30 tablet 9  . LEVEMIR FLEXTOUCH 100 UNIT/ML FlexPen Inject into the skin.    . metFORMIN (GLUCOPHAGE) 1000 MG tablet Take 1,000 mg by mouth 2 (two) times daily.     . metoprolol (LOPRESSOR) 50 MG tablet Take 50 mg by mouth 2 (two)  times daily.     . nitroGLYCERIN (NITROSTAT) 0.4 MG SL tablet Place 1 tablet (0.4 mg total) under the tongue every 5 (five) minutes as needed for chest pain. 25 tablet 3  . NOVOTWIST 30G X 8 MM MISC One needle daily    . omeprazole (PRILOSEC) 20 MG capsule Take 20 mg by mouth daily.     Glory Rosebush Delica Lancets 51Z MISC Apply topically.    . pregabalin (LYRICA) 75 MG capsule Take 75 mg by mouth every evening.     . silodosin (RAPAFLO) 8 MG CAPS capsule Take 1 capsule (8 mg total) by mouth daily with breakfast. 90 capsule 0  . torsemide (DEMADEX) 20 MG tablet TAKE 1 TABLET BY MOUTH TWICE A DAY 180 tablet 1  . traMADol (ULTRAM) 50 MG tablet Take 1 tablet (50 mg total) by mouth every 6 (six) hours as needed. 30 tablet 1  . VICTOZA 18 MG/3ML SOPN Inject 1.8 mg into the skin in the morning.      No current facility-administered medications on file prior to visit.    Cardiovascular & other pertient studies:  EKG 08/16/2020:  Sinus rhythm 83 bpm Right bundle branch block   Old inferior infarct  ABI 02/17/2019:  This exam reveals normal perfusion of bilateral lower extremity (ABI  1.00). Abnormal waveform at the ankle suggests small vessel disease.  Clinical correlation  recommended.  Carotid duplex 02/17/2019 (Preliminary result): Mild stenosis in the left external carotid artery (<50%). Antegrade right vertebral artery flow. Antegrade left vertebral artery flow. Follow up in one year is appropriate if clinically indicated.  Echocardiogram 02/17/2019: Left ventricle cavity is normal in size. Moderate concentric hypertrophy of the left ventricle. Abnormal septal wall motion due to post-operative coronary artery bypass graft. Mildly depressed LV systolic function with EF 45%. Doppler evidence of grade I (impaired) diastolic dysfunction, normal LAP.  Left atrial cavity is mildly dilated. Structurally normal trileaflet aortic valve with no regurgitation noted. Trace aortic valve stenosis. Trace  MR, trace TR, trace PI. Normal right atrial pressure.   Coronary and bypass graft angiography 04/28/2016: LM: Normal LAD: 50% ostial, 80% mid LAD stenoses, 90% ostial septal perforator stneosis. LCx: 100% ostial occlusion RCA: Small caliber vessel. Severe RV marginal stenosis LIMA-LAD: Patent Radial artery-OM: Patent SVG-RCA: Occluded SVG-???: Prior stents. Occluded  Nuclear stress test 04/23/2016: No diagnostic ST segment changes to indicate ischemia. Small, moderate intensity, apical to basal inferior defect that exhibits partial reversibility apex and fixed toward the base. This is consistent with scar with peri-infarct ischemia towards the apex. Moderate-sized, severe intensity, mid to basal inferolateral and anterolateral defect that is partially reversible throughout and consistent with moderate ischemic territory. This is an intermediate risk study. Nuclear stress EF: 49%.    Recent labs: 06/12/2020: Cr 1.4  04/02/2020: Cr 1.17  11/01/2019: Glucose 105, BUN/Cr 18/1.17. EGFR 59. Na/K 143/4.1.   10/18/2019: Glucose 142, BUN/Cr 29/2.09. EGFR 29. Na/K 139/4.5.   10/04/2019: Glucose 125, BUN/Cr 30/1.61. EGFR 40. Na/K 139/5.2  HbA1C 7.7%  02/23/2019: Glucose 160.  BUN/creatinine 20/1.49.  EGFR 44.  Sodium 137, potassium 5.0.  01/05/2019: BUN/Cr 33/1.58. eGFR 41/ Na/K 139/4.3.   12/21/2018: Cr 1.32. eGFR 51. HbA1C 7.2%. Chol 214, TG 166, HDL 33, LDL 69.    Review of Systems  Constitutional: Negative for malaise/fatigue.  Cardiovascular: Positive for leg swelling (improved). Negative for chest pain, dyspnea on exertion (stable), near-syncope, palpitations and syncope.  Neurological: Positive for paresthesias.         Vitals:   08/16/20 1020  BP: 127/76  Pulse: 77  Temp: 98.1 F (36.7 C)  SpO2: 95%     Body mass index is 38.26 kg/m. Filed Weights   08/16/20 1020  Weight: 290 lb (131.5 kg)     Objective:   Physical Exam Vitals and nursing note  reviewed.  Constitutional:      General: He is not in acute distress.    Comments: Moderately obese  Neck:     Vascular: No JVD.  Cardiovascular:     Rate and Rhythm: Normal rate and regular rhythm.     Pulses: Intact distal pulses.          Carotid pulses are on the left side with bruit.      Femoral pulses are 2+ on the right side and 2+ on the left side.      Popliteal pulses are 1+ on the right side and 1+ on the left side.       Dorsalis pedis pulses are 0 on the right side and 0 on the left side.       Posterior tibial pulses are 0 on the right side and 0 on the left side.     Heart sounds: Normal heart sounds. No murmur heard.     Comments: Delayed capillary refill, without any ulcers, wounds, gangrene.  Pulmonary:     Effort: Pulmonary  effort is normal.     Breath sounds: Normal breath sounds. No wheezing or rales.  Musculoskeletal:     Right lower leg: Edema (2+) present.     Left lower leg: Edema (2+) present.  Skin:    Comments: Callous on right sole. No ulcer Poor toenail hygeine           Assessment & Recommendations:   80 y.o. Caucasian male with hypertension, type 2 diabetes mellitus, hyperlipidemia, morbid obesity, coronary artery disease CABG 1996, redo CABG 2012, with patent LIMA-LAD, right radial-OM, occluded SVG-RCA and SVG-diagonal, with severe native vessel disease, renal cell carcinoma.   CAD with stable angina: H/o CABG and redo CABG, 2/4 grafts patent, severe native vessel CAD on cath in 2017. Angina symptoms fairly well controlled with current medical therapy.  Continue Plavix, statin, metoprolol, Imdur.  Hypertension: Well controlled.  PAD: Absent pulses with pre-ulcerative callous. No obvious claudication symptoms.  Continue aggressive certification.  Exercise importance of dismission every day.  I referred him to podiatry to manage his calluses as well as poor toenail hygiene. Indication for revascularization at this time.  Leg  edema: Suspect it is related to HFpEF. I will check his BMP today, before changing any torsemide dose.   F/u in 4 weeks  [place], MD [facility] [place] Pager: [phone] Office: [phone]

## 2020-08-16 ENCOUNTER — Encounter: Payer: Self-pay | Admitting: Cardiology

## 2020-08-16 ENCOUNTER — Other Ambulatory Visit: Payer: Self-pay

## 2020-08-16 ENCOUNTER — Ambulatory Visit: Payer: Medicare Other | Admitting: Cardiology

## 2020-08-16 VITALS — BP 127/76 | HR 77 | Temp 98.1°F | Ht 73.0 in | Wt 290.0 lb

## 2020-08-16 DIAGNOSIS — L84 Corns and callosities: Secondary | ICD-10-CM

## 2020-08-16 DIAGNOSIS — N1832 Chronic kidney disease, stage 3b: Secondary | ICD-10-CM

## 2020-08-16 DIAGNOSIS — I10 Essential (primary) hypertension: Secondary | ICD-10-CM

## 2020-08-16 DIAGNOSIS — I25708 Atherosclerosis of coronary artery bypass graft(s), unspecified, with other forms of angina pectoris: Secondary | ICD-10-CM

## 2020-08-16 DIAGNOSIS — I5032 Chronic diastolic (congestive) heart failure: Secondary | ICD-10-CM

## 2020-08-17 ENCOUNTER — Ambulatory Visit (INDEPENDENT_AMBULATORY_CARE_PROVIDER_SITE_OTHER): Payer: Medicare Other | Admitting: Urology

## 2020-08-17 ENCOUNTER — Encounter: Payer: Self-pay | Admitting: Urology

## 2020-08-17 ENCOUNTER — Other Ambulatory Visit: Payer: Self-pay

## 2020-08-17 VITALS — BP 133/64 | HR 86 | Temp 98.7°F | Resp 14 | Ht 73.0 in | Wt 290.0 lb

## 2020-08-17 DIAGNOSIS — R3912 Poor urinary stream: Secondary | ICD-10-CM | POA: Diagnosis not present

## 2020-08-17 DIAGNOSIS — N138 Other obstructive and reflux uropathy: Secondary | ICD-10-CM

## 2020-08-17 DIAGNOSIS — N401 Enlarged prostate with lower urinary tract symptoms: Secondary | ICD-10-CM | POA: Diagnosis not present

## 2020-08-17 LAB — BLADDER SCAN AMB NON-IMAGING: Scan Result: 661

## 2020-08-17 MED ORDER — SILODOSIN 8 MG PO CAPS
8.0000 mg | ORAL_CAPSULE | Freq: Two times a day (BID) | ORAL | 11 refills | Status: DC
Start: 2020-08-17 — End: 2020-12-28

## 2020-08-17 NOTE — Progress Notes (Signed)
Urological Symptom Review  Patient is experiencing the following symptoms: Hard to postpone urination Stream starts and stops   Review of Systems  Gastrointestinal (upper)  : Negative for upper GI symptoms  Gastrointestinal (lower) : Negative for lower GI symptoms  Constitutional : Negative for symptoms  Skin: Itching  Eyes: Blurred vision  Ear/Nose/Throat : Negative for Ear/Nose/Throat symptoms  Hematologic/Lymphatic: Negative for Hematologic/Lymphatic symptoms  Cardiovascular : Leg Swelling  Respiratory : Negative for respiratory symptoms  Endocrine: Negative for endocrine symptoms  Musculoskeletal: Negative for musculoskeletal symptoms  Neurological: Negative for neurological symptoms  Psychologic: Negative for psychiatric symptoms
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
## 2020-08-17 NOTE — Addendum Note (Signed)
Addended by: Valentina Lucks on: 08/17/2020 11:56 AM   Modules accepted: Orders

## 2020-08-17 NOTE — Patient Instructions (Signed)

## 2020-08-17 NOTE — Progress Notes (Signed)
PVR= greater than 661 ml.

## 2020-08-24 ENCOUNTER — Ambulatory Visit: Payer: Medicare Other | Admitting: Podiatry

## 2020-08-24 ENCOUNTER — Other Ambulatory Visit: Payer: Self-pay

## 2020-08-24 ENCOUNTER — Encounter: Payer: Self-pay | Admitting: Podiatry

## 2020-08-24 DIAGNOSIS — E1142 Type 2 diabetes mellitus with diabetic polyneuropathy: Secondary | ICD-10-CM | POA: Diagnosis not present

## 2020-08-24 DIAGNOSIS — I999 Unspecified disorder of circulatory system: Secondary | ICD-10-CM | POA: Diagnosis not present

## 2020-08-24 DIAGNOSIS — Z794 Long term (current) use of insulin: Secondary | ICD-10-CM

## 2020-08-24 DIAGNOSIS — B351 Tinea unguium: Secondary | ICD-10-CM

## 2020-08-24 DIAGNOSIS — M79675 Pain in left toe(s): Secondary | ICD-10-CM

## 2020-08-24 DIAGNOSIS — M79674 Pain in right toe(s): Secondary | ICD-10-CM

## 2020-08-24 NOTE — Progress Notes (Signed)
  Subjective:  Patient ID: Blake Hurst, male    DOB: 1940/10/05,  MRN: [id number]  Chief Complaint  Patient presents with  . Nail Problem    Foot exam Nail and callus trim    80 y.o. male returns for the above complaint.  Patient presents with thickened elongated dystrophic toenails x10.  Patient states that he would like to get his foot evaluated.  He is a diabetic with last A1c of 7.2.  He was sent here by his cardiologist.  He wants to make sure that he is not at risk for developing any kind of ulceration or lesions.  He denies any other acute complaints.  He would like to have the nails debrided down as well as is causing pain with ambulation  Objective:  There were no vitals filed for this visit. Podiatric Exam: Vascular: dorsalis pedis non  and posterior tibial nonpulses are palpable bilateral. Capillary return is immediate. Temperature gradient is WNL. Skin turgor WNL  Sensorium: Decreased Semmes Weinstein monofilament test.  Decreased tactile sensation bilaterally. Nail Exam: Pt has thick disfigured discolored nails with subungual debris noted bilateral entire nail hallux through fifth toenails.  Pain on palpation to the nails. Ulcer Exam: There is no evidence of ulcer or pre-ulcerative changes or infection. Orthopedic Exam: Muscle tone and strength are WNL. No limitations in general ROM. No crepitus or effusions noted. HAV  B/L.  Hammer toes 2-5  B/L. Skin: No Porokeratosis. No infection or ulcers    Assessment & Plan:   1. Vascular abnormality   2. Pain due to onychomycosis of toenails of both feet   3. Controlled type 2 diabetes mellitus with diabetic polyneuropathy, with long-term current use of insulin ([place])     Patient was evaluated and treated and all questions answered.  Vascular abnormality -I explained to patient the etiology of vascular abnormality and various treatment options were discussed.  Given that I am having hard time palpating his pulses I believe  patient will benefit from ABIs PVRs to assess the flow to both lower extremity.  Patient states understanding and will obtain ABIs PVRs.  At this time he does not have any soft tissue loss.  I discussed shoe gear modification as well as evaluating his foot daily to make sure that there is no open lesions or concerns.  He states understanding  Onychomycosis with pain  -Nails palliatively debrided as below. -Educated on self-care  Procedure: Nail Debridement Rationale: pain  Type of Debridement: manual, sharp debridement. Instrumentation: Nail nipper, rotary burr. Number of Nails: 10  Procedures and Treatment: Consent by patient was obtained for treatment procedures. The patient understood the discussion of treatment and procedures well. All questions were answered thoroughly reviewed. Debridement of mycotic and hypertrophic toenails, 1 through 5 bilateral and clearing of subungual debris. No ulceration, no infection noted.  Return Visit-Office Procedure: Patient instructed to return to the office for a follow up visit 3 months for continued evaluation and treatment.  Boneta Lucks, DPM    No follow-ups on file.

## 2020-08-30 ENCOUNTER — Ambulatory Visit (HOSPITAL_COMMUNITY)
Admission: RE | Admit: 2020-08-30 | Discharge: 2020-08-30 | Disposition: A | Discharge status: Home / Self Care | Payer: Medicare Other | Source: Ambulatory Visit | Attending: Podiatry | Admitting: Podiatry

## 2020-08-30 ENCOUNTER — Other Ambulatory Visit: Payer: Self-pay

## 2020-08-30 DIAGNOSIS — I999 Unspecified disorder of circulatory system: Secondary | ICD-10-CM

## 2020-09-04 ENCOUNTER — Other Ambulatory Visit: Payer: Self-pay

## 2020-09-04 ENCOUNTER — Ambulatory Visit
Admission: RE | Admit: 2020-09-04 | Discharge: 2020-09-04 | Disposition: A | Discharge status: Home / Self Care | Payer: Medicare Other | Source: Ambulatory Visit | Attending: Interventional Radiology | Admitting: Interventional Radiology

## 2020-09-04 DIAGNOSIS — N2889 Other specified disorders of kidney and ureter: Secondary | ICD-10-CM

## 2020-09-04 HISTORY — PX: IR RADIOLOGIST EVAL & MGMT: IMG5224
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
## 2020-09-13 ENCOUNTER — Other Ambulatory Visit: Payer: Self-pay | Admitting: Pharmacist

## 2020-09-13 DIAGNOSIS — N1832 Chronic kidney disease, stage 3b: Secondary | ICD-10-CM

## 2020-09-28 ENCOUNTER — Ambulatory Visit: Payer: Medicare Other | Admitting: Cardiology

## 2020-10-05 ENCOUNTER — Emergency Department (HOSPITAL_COMMUNITY)
Admission: EM | Admit: 2020-10-05 | Discharge: 2020-10-05 | Disposition: A | Discharge status: Home / Self Care | Payer: Medicare Other | Attending: Emergency Medicine | Admitting: Emergency Medicine

## 2020-10-05 ENCOUNTER — Encounter (HOSPITAL_COMMUNITY): Payer: Self-pay

## 2020-10-05 ENCOUNTER — Emergency Department (HOSPITAL_COMMUNITY): Payer: Medicare Other

## 2020-10-05 ENCOUNTER — Encounter: Payer: Self-pay | Admitting: Urology

## 2020-10-05 ENCOUNTER — Ambulatory Visit: Payer: Medicare Other | Admitting: Urology

## 2020-10-05 ENCOUNTER — Other Ambulatory Visit: Payer: Self-pay

## 2020-10-05 VITALS — BP 112/64 | HR 80 | Ht 73.0 in | Wt 290.0 lb

## 2020-10-05 DIAGNOSIS — R6883 Chills (without fever): Secondary | ICD-10-CM | POA: Diagnosis not present

## 2020-10-05 DIAGNOSIS — I130 Hypertensive heart and chronic kidney disease with heart failure and stage 1 through stage 4 chronic kidney disease, or unspecified chronic kidney disease: Secondary | ICD-10-CM | POA: Insufficient documentation

## 2020-10-05 DIAGNOSIS — I509 Heart failure, unspecified: Secondary | ICD-10-CM | POA: Diagnosis not present

## 2020-10-05 DIAGNOSIS — Z955 Presence of coronary angioplasty implant and graft: Secondary | ICD-10-CM | POA: Insufficient documentation

## 2020-10-05 DIAGNOSIS — Z85528 Personal history of other malignant neoplasm of kidney: Secondary | ICD-10-CM | POA: Diagnosis not present

## 2020-10-05 DIAGNOSIS — N401 Enlarged prostate with lower urinary tract symptoms: Secondary | ICD-10-CM | POA: Diagnosis not present

## 2020-10-05 DIAGNOSIS — R251 Tremor, unspecified: Secondary | ICD-10-CM | POA: Insufficient documentation

## 2020-10-05 DIAGNOSIS — Z7984 Long term (current) use of oral hypoglycemic drugs: Secondary | ICD-10-CM | POA: Insufficient documentation

## 2020-10-05 DIAGNOSIS — E119 Type 2 diabetes mellitus without complications: Secondary | ICD-10-CM | POA: Diagnosis not present

## 2020-10-05 DIAGNOSIS — R110 Nausea: Secondary | ICD-10-CM | POA: Diagnosis not present

## 2020-10-05 DIAGNOSIS — Z87891 Personal history of nicotine dependence: Secondary | ICD-10-CM | POA: Insufficient documentation

## 2020-10-05 DIAGNOSIS — N138 Other obstructive and reflux uropathy: Secondary | ICD-10-CM

## 2020-10-05 DIAGNOSIS — I2510 Atherosclerotic heart disease of native coronary artery without angina pectoris: Secondary | ICD-10-CM | POA: Diagnosis not present

## 2020-10-05 DIAGNOSIS — I25119 Atherosclerotic heart disease of native coronary artery with unspecified angina pectoris: Secondary | ICD-10-CM | POA: Diagnosis not present

## 2020-10-05 DIAGNOSIS — N1832 Chronic kidney disease, stage 3b: Secondary | ICD-10-CM | POA: Insufficient documentation

## 2020-10-05 DIAGNOSIS — Z79899 Other long term (current) drug therapy: Secondary | ICD-10-CM | POA: Insufficient documentation

## 2020-10-05 DIAGNOSIS — Z794 Long term (current) use of insulin: Secondary | ICD-10-CM | POA: Diagnosis not present

## 2020-10-05 LAB — CBC WITH DIFFERENTIAL/PLATELET
Abs Immature Granulocytes: 0.04 10*3/uL (ref 0.00–0.07)
Basophils Absolute: 0 10*3/uL (ref 0.0–0.1)
Basophils Relative: 0 %
Eosinophils Absolute: 0.2 10*3/uL (ref 0.0–0.5)
Eosinophils Relative: 2 %
HCT: 42.5 % (ref 39.0–52.0)
Hemoglobin: 13.9 g/dL (ref 13.0–17.0)
Immature Granulocytes: 0 %
Lymphocytes Relative: 4 %
Lymphs Abs: 0.4 10*3/uL — ABNORMAL LOW (ref 0.7–4.0)
MCH: 30.1 pg (ref 26.0–34.0)
MCHC: 32.7 g/dL (ref 30.0–36.0)
MCV: 92 fL (ref 80.0–100.0)
Monocytes Absolute: 0.3 10*3/uL (ref 0.1–1.0)
Monocytes Relative: 4 %
Neutro Abs: 8.2 10*3/uL — ABNORMAL HIGH (ref 1.7–7.7)
Neutrophils Relative %: 90 %
Platelets: 301 10*3/uL (ref 150–400)
RBC: 4.62 MIL/uL (ref 4.22–5.81)
RDW: 14.4 % (ref 11.5–15.5)
WBC: 9.1 10*3/uL (ref 4.0–10.5)
nRBC: 0 % (ref 0.0–0.2)

## 2020-10-05 LAB — URINALYSIS, ROUTINE W REFLEX MICROSCOPIC
Bilirubin Urine: NEGATIVE
Bilirubin, UA: NEGATIVE
Glucose, UA: 50 mg/dL — AB
Hgb urine dipstick: NEGATIVE
Ketones, UA: NEGATIVE
Ketones, ur: NEGATIVE mg/dL
Leukocytes,UA: NEGATIVE
Leukocytes,Ua: NEGATIVE
Nitrite, UA: NEGATIVE
Nitrite: NEGATIVE
Protein, ur: NEGATIVE mg/dL
Protein,UA: NEGATIVE
RBC, UA: NEGATIVE
Specific Gravity, UA: 1.015 (ref 1.005–1.030)
Specific Gravity, Urine: 1.015 (ref 1.005–1.030)
Urobilinogen, Ur: 0.2 mg/dL (ref 0.2–1.0)
pH, UA: 7 (ref 5.0–7.5)
pH: 6 (ref 5.0–8.0)

## 2020-10-05 LAB — LACTIC ACID, PLASMA
Lactic Acid, Venous: 2.8 mmol/L (ref 0.5–1.9)
Lactic Acid, Venous: 2.4 mmol/L (ref 0.5–1.9)

## 2020-10-05 LAB — COMPREHENSIVE METABOLIC PANEL
ALT: 15 U/L (ref 0–44)
AST: 17 U/L (ref 15–41)
Albumin: 4 g/dL (ref 3.5–5.0)
Alkaline Phosphatase: 82 U/L (ref 38–126)
Anion gap: 10 (ref 5–15)
BUN: 17 mg/dL (ref 8–23)
CO2: 28 mmol/L (ref 22–32)
Calcium: 9.3 mg/dL (ref 8.9–10.3)
Chloride: 99 mmol/L (ref 98–111)
Creatinine, Ser: 1.44 mg/dL — ABNORMAL HIGH (ref 0.61–1.24)
GFR, Estimated: 49 mL/min — ABNORMAL LOW (ref >60)
Glucose, Bld: 237 mg/dL — ABNORMAL HIGH (ref 70–99)
Potassium: 3.7 mmol/L (ref 3.5–5.1)
Sodium: 137 mmol/L (ref 135–145)
Total Bilirubin: 1.1 mg/dL (ref 0.3–1.2)
Total Protein: 7 g/dL (ref 6.5–8.1)

## 2020-10-05 MED ORDER — CEPHALEXIN 500 MG PO CAPS: 500.0000 mg | ORAL_CAPSULE | Freq: Four times a day (QID) | ORAL | 0 refills | Status: DC

## 2020-10-05 MED ORDER — CIPROFLOXACIN HCL 500 MG PO TABS
500.0000 mg | ORAL_TABLET | Freq: Once | ORAL | Status: AC
  Administered 2020-10-05: 500 mg via ORAL

## 2020-10-05 MED ORDER — SODIUM CHLORIDE 0.9 % IV SOLN
1.0000 g | Freq: Once | INTRAVENOUS | Status: AC
  Administered 2020-10-05: 1 g via INTRAVENOUS
  Filled 2020-10-05: qty 10

## 2020-10-05 NOTE — Progress Notes (Signed)
   10/05/20  CC: followup BPH  HPI: Blake Hurst is a 80yo here for followup for BPH. He has failed multiple alpha blockers  Blood pressure 112/64, pulse 80, height 6\' 1"  (1.854 m), weight 290 lb (131.5 kg). NED. A&Ox3.   No respiratory distress   Abd soft, NT, ND Normal phallus with bilateral descended testicles  Cystoscopy Procedure Note  Patient identification was confirmed, informed consent was obtained, and patient was prepped using Betadine solution.  Lidocaine jelly was administered per urethral meatus.     Pre-Procedure: - Inspection reveals a normal caliber ureteral meatus.  Procedure: The flexible cystoscope was introduced without difficulty - No urethral strictures/lesions are present. - Enlarged prostate no median lobe - Normal bladder neck - Bilateral ureteral orifices identified - Bladder mucosa  reveals no ulcers, tumors, or lesions - No bladder stones - No trabeculation  Retroflexion shows no intravesical prostatic protrusion   Post-Procedure: - Patient tolerated the procedure well  Assessment/ Plan: We discussed the management of his BPH including continued medical therapy, Rezum, Urolift, TURP and simple prostatectomy. After discussing the options the patient has elected to proceed with continue medical therapy. He will followup in 3 months with a PVR.   No follow-ups on file.  Nicolette Bang, MD

## 2020-10-05 NOTE — Progress Notes (Signed)
Urological Symptom Review  Patient is experiencing the following symptoms: Get up at night to urinate Stream starts and stops Erection problems (male only) (none)  Review of Systems  Gastrointestinal (upper)  : Negative for upper GI symptoms  Gastrointestinal (lower) : Negative for lower GI symptoms  Constitutional : Negative for symptoms  Skin: Negative for skin symptoms  Eyes: Negative for eye symptoms  Ear/Nose/Throat : Negative for Ear/Nose/Throat symptoms  Hematologic/Lymphatic: Negative for Hematologic/Lymphatic symptoms  Cardiovascular : Leg swelling  Respiratory : Negative for respiratory symptoms  Endocrine: Negative for endocrine symptoms  Musculoskeletal: Negative for musculoskeletal symptoms  Neurological: Negative for neurological symptoms  Psychologic: Negative for psychiatric symptoms

## 2020-10-05 NOTE — Discharge Instructions (Addendum)
Drink plenty of fluids.  Follow-up with your family doctor next week.  Return if any problem

## 2020-10-05 NOTE — ED Notes (Signed)
Date and time results received: 10/05/20 9:27 PM  Test: Lactic Acid Critical Value: 2.3  Name of Provider Notified: Dr. Roderic Palau   Orders Received? Or Actions Taken?:
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
## 2020-10-05 NOTE — ED Triage Notes (Signed)
Pt arrived via [facility] c/o 1 episode of emesis at home. Upon arrival EMS has 20g IV established in Right AC. Pt seen by nephrologist earlier today and did not have any symptoms at this time. Pt denies pain at this time.

## 2020-10-05 NOTE — ED Notes (Signed)
Blood cultures drawn before administering antibiotics

## 2020-10-07 LAB — URINE CULTURE: Culture: NO GROWTH

## 2020-10-08 ENCOUNTER — Other Ambulatory Visit: Payer: Self-pay

## 2020-10-08 ENCOUNTER — Ambulatory Visit: Payer: Medicare Other | Admitting: Cardiology

## 2020-10-08 ENCOUNTER — Encounter: Payer: Self-pay | Admitting: Cardiology

## 2020-10-08 VITALS — BP 134/72 | HR 78 | Temp 97.7°F | Resp 17 | Ht 73.0 in | Wt 293.2 lb

## 2020-10-08 DIAGNOSIS — I739 Peripheral vascular disease, unspecified: Secondary | ICD-10-CM

## 2020-10-08 DIAGNOSIS — I25708 Atherosclerosis of coronary artery bypass graft(s), unspecified, with other forms of angina pectoris: Secondary | ICD-10-CM

## 2020-10-08 DIAGNOSIS — I5032 Chronic diastolic (congestive) heart failure: Secondary | ICD-10-CM

## 2020-10-08 NOTE — Progress Notes (Signed)
Follow up visit  Subjective:   Blake Hurst, male    DOB: 12/10/1940, 80 y.o.   MRN: [id number]  HPI   Chief Complaint  Patient presents with  . Follow-up    4 weeks    80 y.o. Caucasian male with hypertension, type 2 diabetes mellitus, hyperlipidemia, morbid obesity, coronary artery disease CABG 1996, redo CABG 2012, with patent LIMA-LAD, right radial-OM, occluded SVG-RCA and SVG-diagonal, with severe native vessel disease, renal cell carcinoma.  Patient reently had an ED visit with chills, after cystoscopy. Lactic acid was mildly elevated, but cultures were negative. He was treated with IV fluids. He was seen by podiatry and underwent nail debridement. ABI was normal, but TBI was abnormal b/l.   Current Outpatient Medications on File Prior to Visit  Medication Sig Dispense Refill  . amLODipine (NORVASC) 5 MG tablet TAKE 1 TABLET BY MOUTH EVERY DAY 90 tablet 1  . atorvastatin (LIPITOR) 80 MG tablet Take 80 mg by mouth daily at 6 PM. One tablet daily    . BD PEN NEEDLE NANO 2ND GEN 32G X 4 MM MISC daily.    . cephALEXin (KEFLEX) 500 MG capsule Take 1 capsule (500 mg total) by mouth 4 (four) times daily. 28 capsule 0  . clopidogrel (PLAVIX) 75 MG tablet Take 75 mg by mouth daily.     Marland Kitchen glucose blood (ONETOUCH VERIO) test strip USE TO TEST BLOOD SUGAR 2-3 TIMES DAILY    . insulin detemir (LEVEMIR) 100 UNIT/ML injection Inject 60 Units into the skin in the morning.    . isosorbide mononitrate (IMDUR) 30 MG 24 hr tablet TAKE 1 TABLET BY MOUTH EVERY DAY (Patient taking differently: Take 30 mg by mouth daily.) 30 tablet 9  . LEVEMIR FLEXTOUCH 100 UNIT/ML FlexPen Inject into the skin.    . metFORMIN (GLUCOPHAGE) 1000 MG tablet Take 1,000 mg by mouth 2 (two) times daily.     . metoprolol (LOPRESSOR) 50 MG tablet Take 50 mg by mouth 2 (two) times daily.     . nitroGLYCERIN (NITROSTAT) 0.4 MG SL tablet Place 1 tablet (0.4 mg total) under the tongue every 5 (five) minutes as needed for  chest pain. 25 tablet 3  . NOVOTWIST 30G X 8 MM MISC One needle daily    . omeprazole (PRILOSEC) 20 MG capsule Take 20 mg by mouth daily.     Glory Rosebush Delica Lancets 86V MISC Apply topically.    . pregabalin (LYRICA) 75 MG capsule Take 75 mg by mouth every evening.     . silodosin (RAPAFLO) 8 MG CAPS capsule Take 1 capsule (8 mg total) by mouth in the morning and at bedtime. 60 capsule 11  . torsemide (DEMADEX) 20 MG tablet TAKE 1 TABLET BY MOUTH TWICE A DAY 180 tablet 1  . traMADol (ULTRAM) 50 MG tablet Take 1 tablet (50 mg total) by mouth every 6 (six) hours as needed. 30 tablet 1  . VICTOZA 18 MG/3ML SOPN Inject 1.8 mg into the skin in the morning.      No current facility-administered medications on file prior to visit.    Cardiovascular & other pertient studies:  ABI 08/30/2020: Right: Resting right ankle-brachial index is within normal range. No  evidence of significant right lower extremity arterial disease.  The right toe-brachial index is abnormal.   Left: Resting left ankle-brachial index is within normal range. No  evidence of significant left lower extremity arterial disease.  The left toe-brachial index is abnormal.   EKG 08/16/2020:  Sinus rhythm 83 bpm Right bundle branch block   Old inferior infarct  ABI 02/17/2019:  This exam reveals normal perfusion of bilateral lower extremity (ABI  1.00). Abnormal waveform at the ankle suggests small vessel disease.  Clinical correlation recommended.  Carotid duplex 02/17/2019 (Preliminary result): Mild stenosis in the left external carotid artery (<50%). Antegrade right vertebral artery flow. Antegrade left vertebral artery flow. Follow up in one year is appropriate if clinically indicated.  Echocardiogram 02/17/2019: Left ventricle cavity is normal in size. Moderate concentric hypertrophy of the left ventricle. Abnormal septal wall motion due to post-operative coronary artery bypass graft. Mildly depressed LV systolic  function with EF 45%. Doppler evidence of grade I (impaired) diastolic dysfunction, normal LAP.  Left atrial cavity is mildly dilated. Structurally normal trileaflet aortic valve with no regurgitation noted. Trace aortic valve stenosis. Trace MR, trace TR, trace PI. Normal right atrial pressure.   Coronary and bypass graft angiography 04/28/2016: LM: Normal LAD: 50% ostial, 80% mid LAD stenoses, 90% ostial septal perforator stneosis. LCx: 100% ostial occlusion RCA: Small caliber vessel. Severe RV marginal stenosis LIMA-LAD: Patent Radial artery-OM: Patent SVG-RCA: Occluded SVG-???: Prior stents. Occluded  Nuclear stress test 04/23/2016: No diagnostic ST segment changes to indicate ischemia. Small, moderate intensity, apical to basal inferior defect that exhibits partial reversibility apex and fixed toward the base. This is consistent with scar with peri-infarct ischemia towards the apex. Moderate-sized, severe intensity, mid to basal inferolateral and anterolateral defect that is partially reversible throughout and consistent with moderate ischemic territory. This is an intermediate risk study. Nuclear stress EF: 49%.    Recent labs: 10/05/2020: Glucose 237, BUN/Cr 17/1.44. EGFR 49. Na/K 137/3.7. Rest of the CMP normal H/H 13/42. MCV 92. Platelets 301  06/12/2020: Cr 1.4  04/02/2020: Cr 1.17  11/01/2019: Glucose 105, BUN/Cr 18/1.17. EGFR 59. Na/K 143/4.1.   10/18/2019: Glucose 142, BUN/Cr 29/2.09. EGFR 29. Na/K 139/4.5.   10/04/2019: Glucose 125, BUN/Cr 30/1.61. EGFR 40. Na/K 139/5.2  HbA1C 7.7%  02/23/2019: Glucose 160.  BUN/creatinine 20/1.49.  EGFR 44.  Sodium 137, potassium 5.0.  01/05/2019: BUN/Cr 33/1.58. eGFR 41/ Na/K 139/4.3.   12/21/2018: Cr 1.32. eGFR 51. HbA1C 7.2%. Chol 214, TG 166, HDL 33, LDL 69.    Review of Systems  Constitutional: Negative for malaise/fatigue.  Cardiovascular: Positive for leg swelling (improved). Negative for chest pain, dyspnea  on exertion (stable), near-syncope, palpitations and syncope.  Neurological: Positive for paresthesias.         Vitals:   10/08/20 1050  BP: 134/72  Pulse: 78  Resp: 17  Temp: 97.7 F (36.5 C)  SpO2: 96%     Body mass index is 38.68 kg/m. Filed Weights   10/08/20 1050  Weight: 293 lb 3.2 oz (133 kg)     Objective:   Physical Exam Vitals and nursing note reviewed.  Constitutional:      General: He is not in acute distress.    Comments: Moderately obese  Neck:     Vascular: No JVD.  Cardiovascular:     Rate and Rhythm: Normal rate and regular rhythm.     Pulses: Intact distal pulses.          Carotid pulses are on the left side with bruit.      Femoral pulses are 2+ on the right side and 2+ on the left side.      Popliteal pulses are 1+ on the right side and 1+ on the left side.       Dorsalis pedis  pulses are 0 on the right side and 0 on the left side.       Posterior tibial pulses are 0 on the right side and 0 on the left side.     Heart sounds: Normal heart sounds. No murmur heard.     Comments: Delayed capillary refill, without any ulcers, wounds, gangrene.  Pulmonary:     Effort: Pulmonary effort is normal.     Breath sounds: Normal breath sounds. No wheezing or rales.  Musculoskeletal:     Right lower leg: Edema (2+) present.     Left lower leg: Edema (2+) present.  Skin:    Comments: Callous on right sole. No ulcer Poor toenail hygeine           Assessment & Recommendations:   80 y.o. Caucasian male with hypertension, type 2 diabetes mellitus, hyperlipidemia, morbid obesity, coronary artery disease CABG 1996, redo CABG 2012, with patent LIMA-LAD, right radial-OM, occluded SVG-RCA and SVG-diagonal, with severe native vessel disease, renal cell carcinoma.   CAD with stable angina: H/o CABG and redo CABG, 2/4 grafts patent, severe native vessel CAD on cath in 2017. No current angina symptoms. Continue Plavix, statin, metoprolol,  Imdur.  Hypertension: Well controlled.  PAD: Absent pulses with pre-ulcerative callous. No obvious claudication symptoms.  Abnormal TBI. Continue aggressive risk stratification. No indication for revascularization at this time.  Leg edema: Mild, stable  F/u in 6 months  Manish Esther Hardy, MD [facility] Pager: [phone] Office: [phone]

## 2020-10-10 LAB — CULTURE, BLOOD (ROUTINE X 2)
Culture: NO GROWTH
Culture: NO GROWTH
Special Requests: ADEQUATE

## 2020-10-12 DIAGNOSIS — H903 Sensorineural hearing loss, bilateral: Secondary | ICD-10-CM | POA: Insufficient documentation

## 2020-10-16 ENCOUNTER — Encounter: Payer: Self-pay | Admitting: Urology

## 2020-10-16 NOTE — Patient Instructions (Signed)

## 2020-11-28 ENCOUNTER — Other Ambulatory Visit: Payer: Self-pay | Admitting: Interventional Radiology

## 2020-11-28 DIAGNOSIS — N2889 Other specified disorders of kidney and ureter: Secondary | ICD-10-CM

## 2020-12-28 ENCOUNTER — Ambulatory Visit (INDEPENDENT_AMBULATORY_CARE_PROVIDER_SITE_OTHER): Payer: Medicare Other | Admitting: Urology

## 2020-12-28 ENCOUNTER — Encounter: Payer: Self-pay | Admitting: Urology

## 2020-12-28 ENCOUNTER — Other Ambulatory Visit: Payer: Self-pay

## 2020-12-28 VITALS — BP 112/67 | HR 84

## 2020-12-28 DIAGNOSIS — R3912 Poor urinary stream: Secondary | ICD-10-CM | POA: Diagnosis not present

## 2020-12-28 DIAGNOSIS — N401 Enlarged prostate with lower urinary tract symptoms: Secondary | ICD-10-CM

## 2020-12-28 DIAGNOSIS — N138 Other obstructive and reflux uropathy: Secondary | ICD-10-CM

## 2020-12-28 LAB — URINALYSIS, ROUTINE W REFLEX MICROSCOPIC
Bilirubin, UA: NEGATIVE
Glucose, UA: NEGATIVE
Ketones, UA: NEGATIVE
Leukocytes,UA: NEGATIVE
Nitrite, UA: NEGATIVE
Protein,UA: NEGATIVE
RBC, UA: NEGATIVE
Specific Gravity, UA: 1.015 (ref 1.005–1.030)
Urobilinogen, Ur: 0.2 mg/dL (ref 0.2–1.0)
pH, UA: 7 (ref 5.0–7.5)

## 2020-12-28 LAB — BLADDER SCAN AMB NON-IMAGING: Scan Result: 502

## 2020-12-28 MED ORDER — SILODOSIN 8 MG PO CAPS: 8.0000 mg | ORAL_CAPSULE | Freq: Two times a day (BID) | ORAL | 11 refills | Status: DC
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
## 2020-12-28 NOTE — Patient Instructions (Signed)
Benign Prostatic Hyperplasia  Benign prostatic hyperplasia (BPH) is an enlarged prostate gland that is caused by the normal aging process and not by cancer. The prostate is a walnut-sized gland that is involved in the production of semen. It is located in front of the rectum and below the bladder. The bladder stores urine and the urethra is the tube that carries the urine out of the body. The prostate may get bigger asa man gets older. An enlarged prostate can press on the urethra. This can make it harder to pass urine. The build-up of urine in the bladder can cause infection. Back pressure and infection may progress to bladder damage and kidney (renal) failure. What are the causes? This condition is part of a normal aging process. However, not all men develop problems from this condition. If the prostate enlarges away from the urethra, urine flow will not be blocked. If it enlarges toward the urethra andcompresses it, there will be problems passing urine. What increases the risk? This condition is more likely to develop in men over the age of 50 years. What are the signs or symptoms? Symptoms of this condition include: Getting up often during the night to urinate. Needing to urinate frequently during the day. Difficulty starting urine flow. Decrease in size and strength of your urine stream. Leaking (dribbling) after urinating. Inability to pass urine. This needs immediate treatment. Inability to completely empty your bladder. Pain when you pass urine. This is more common if there is also an infection. Urinary tract infection (UTI). How is this diagnosed? This condition is diagnosed based on your medical history, a physical exam, and your symptoms. Tests will also be done, such as: A post-void bladder scan. This measures any amount of urine that may remain in your bladder after you finish urinating. A digital rectal exam. In a rectal exam, your health care provider checks your prostate by  putting a lubricated, gloved finger into your rectum to feel the back of your prostate gland. This exam detects the size of your gland and any abnormal lumps or growths. An exam of your urine (urinalysis). A prostate specific antigen (PSA) screening. This is a blood test used to screen for prostate cancer. An ultrasound. This test uses sound waves to electronically produce a picture of your prostate gland. Your health care provider may refer you to a specialist in kidney and prostate diseases (urologist). How is this treated? Once symptoms begin, your health care provider will monitor your condition (active surveillance or watchful waiting). Treatment for this condition will depend on the severity of your condition. Treatment may include: Observation and yearly exams. This may be the only treatment needed if your condition and symptoms are mild. Medicines to relieve your symptoms, including: Medicines to shrink the prostate. Medicines to relax the muscle of the prostate. Surgery in severe cases. Surgery may include: Prostatectomy. In this procedure, the prostate tissue is removed completely through an open incision or with a laparoscope or robotics. Transurethral resection of the prostate (TURP). In this procedure, a tool is inserted through the opening at the tip of the penis (urethra). It is used to cut away tissue of the inner core of the prostate. The pieces are removed through the same opening of the penis. This removes the blockage. Transurethral incision (TUIP). In this procedure, small cuts are made in the prostate. This lessens the prostate's pressure on the urethra. Transurethral microwave thermotherapy (TUMT). This procedure uses microwaves to create heat. The heat destroys and removes a small   amount of prostate tissue. Transurethral needle ablation (TUNA). This procedure uses radio frequencies to destroy and remove a small amount of prostate tissue. Interstitial laser coagulation (ILC).  This procedure uses a laser to destroy and remove a small amount of prostate tissue. Transurethral electrovaporization (TUVP). This procedure uses electrodes to destroy and remove a small amount of prostate tissue. Prostatic urethral lift. This procedure inserts an implant to push the lobes of the prostate away from the urethra. Follow these instructions at home: Take over-the-counter and prescription medicines only as told by your health care provider. Monitor your symptoms for any changes. Contact your health care provider with any changes. Avoid drinking large amounts of liquid before going to bed or out in public. Avoid or reduce how much caffeine or alcohol you drink. Give yourself time when you urinate. Keep all follow-up visits as told by your health care provider. This is important. Contact a health care provider if: You have unexplained back pain. Your symptoms do not get better with treatment. You develop side effects from the medicine you are taking. Your urine becomes very dark or has a bad smell. Your lower abdomen becomes distended and you have trouble passing your urine. Get help right away if: You have a fever or chills. You suddenly cannot urinate. You feel lightheaded, or very dizzy, or you faint. There are large amounts of blood or clots in the urine. Your urinary problems become hard to manage. You develop moderate to severe low back or flank pain. The flank is the side of your body between the ribs and the hip. These symptoms may represent a serious problem that is an emergency. Do not wait to see if the symptoms will go away. Get medical help right away. Call your local emergency services (911 in the [place] Do not drive yourself to the hospital. Summary Benign prostatic hyperplasia (BPH) is an enlarged prostate that is caused by the normal aging process and not by cancer. An enlarged prostate can press on the urethra. This can make it hard to pass urine. This  condition is part of a normal aging process and is more likely to develop in men over the age of 50 years. Get help right away if you suddenly cannot urinate. This information is not intended to replace advice given to you by your health care provider. Make sure you discuss any questions you have with your healthcare provider. Document Revised: 01/12/2020 Document Reviewed: 01/12/2020 Elsevier Patient Education  2022 [employer]  

## 2020-12-28 NOTE — Progress Notes (Signed)
post void residual=502 ml  Urological Symptom Review  Patient is experiencing the following symptoms: none   Review of Systems  Gastrointestinal (upper)  : Negative for upper GI symptoms  Gastrointestinal (lower) : Negative for lower GI symptoms  Constitutional : Negative for symptoms  Skin: Negative for skin symptoms  Eyes: Negative for eye symptoms  Ear/Nose/Throat : Negative for Ear/Nose/Throat symptoms  Hematologic/Lymphatic: Negative for Hematologic/Lymphatic symptoms  Cardiovascular : Negative for cardiovascular symptoms  Respiratory : Negative for respiratory symptoms  Endocrine: Negative for endocrine symptoms  Musculoskeletal: Negative for musculoskeletal symptoms  Neurological: Negative for neurological symptoms  Psychologic: Negative for psychiatric symptoms

## 2020-12-31 ENCOUNTER — Other Ambulatory Visit: Payer: Self-pay | Admitting: Cardiology

## 2020-12-31 DIAGNOSIS — I10 Essential (primary) hypertension: Secondary | ICD-10-CM

## 2021-03-07 ENCOUNTER — Other Ambulatory Visit: Payer: Self-pay

## 2021-03-07 ENCOUNTER — Ambulatory Visit (HOSPITAL_COMMUNITY)
Admission: RE | Admit: 2021-03-07 | Discharge: 2021-03-07 | Disposition: A | Discharge status: Home / Self Care | Payer: Medicare Other | Source: Ambulatory Visit | Attending: Interventional Radiology | Admitting: Interventional Radiology

## 2021-03-07 DIAGNOSIS — N2889 Other specified disorders of kidney and ureter: Secondary | ICD-10-CM | POA: Diagnosis not present

## 2021-03-07 LAB — POCT I-STAT CREATININE: Creatinine, Ser: 1.3 mg/dL — ABNORMAL HIGH (ref 0.61–1.24)

## 2021-03-07 MED ORDER — GADOBUTROL 1 MMOL/ML IV SOLN
10.0000 mL | Freq: Once | INTRAVENOUS | Status: AC | PRN
  Administered 2021-03-07: 10 mL via INTRAVENOUS

## 2021-03-12 ENCOUNTER — Other Ambulatory Visit: Payer: Self-pay

## 2021-03-12 ENCOUNTER — Encounter: Payer: Self-pay | Admitting: *Deleted

## 2021-03-12 ENCOUNTER — Ambulatory Visit
Admission: RE | Admit: 2021-03-12 | Discharge: 2021-03-12 | Disposition: A | Discharge status: Home / Self Care | Payer: Medicare Other | Source: Ambulatory Visit | Attending: Interventional Radiology | Admitting: Interventional Radiology

## 2021-03-12 DIAGNOSIS — N2889 Other specified disorders of kidney and ureter: Secondary | ICD-10-CM

## 2021-03-12 HISTORY — PX: IR RADIOLOGIST EVAL & MGMT: IMG5224
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
## 2021-03-16 ENCOUNTER — Other Ambulatory Visit: Payer: Self-pay | Admitting: Cardiology

## 2021-03-16 DIAGNOSIS — R600 Localized edema: Secondary | ICD-10-CM

## 2021-03-18 ENCOUNTER — Encounter: Payer: Self-pay | Admitting: Pharmacist

## 2021-03-18 NOTE — Progress Notes (Signed)
CARE PLAN ENTRY  03/18/2021 Name: Blake Hurst MRN: [id number] DOB: January 14, 1941  Delena Bali is enrolled in Remote Patient Monitoring/Principle Care Monitoring.  Date of Enrollment: 10/03/19 Supervising physician: Vernell Leep Indication: HTN  Remote Readings: Compliant and Avg BP: 132/73, HR:69  Next scheduled OV: 04/10/21  Pharmacist Clinical Goal(s):  Over the next 90 days, patient will demonstrate Improved medication adherence as evidenced by medication fill history Over the next 90 days, patient will demonstrate improved understanding of prescribed medications and rationale for usage as evidenced by patient teach back Over the next 90 days, patient will experience decrease in ED visits. ED visits in last 6 months = 1 Over the next 90 days, patient will not experience hospital admission. Hospital Admissions in last 6 months = 0  Interventions: Provider and Inter-disciplinary care team collaboration (see longitudinal plan of care) Comprehensive medication review performed. Discussed plans with patient for ongoing care management follow up and provided patient with direct contact information for care management team Collaboration with provider re: medication management  Patient Self Care Activities:  Self administers medications as prescribed Attends all scheduled provider appointments Performs ADL's independently Performs IADL's independently  Allergies  Allergen Reactions   Niacin And Related Other (See Comments)    headaches   Outpatient Encounter Medications as of 03/18/2021  Medication Sig   amLODipine (NORVASC) 5 MG tablet TAKE 1 TABLET BY MOUTH EVERY DAY   atorvastatin (LIPITOR) 80 MG tablet Take 80 mg by mouth daily at 6 PM. One tablet daily   BD PEN NEEDLE NANO 2ND GEN 32G X 4 MM MISC daily.   clopidogrel (PLAVIX) 75 MG tablet Take 75 mg by mouth daily.    glucose blood (ONETOUCH VERIO) test strip USE TO TEST BLOOD SUGAR 2-3 TIMES DAILY   insulin  detemir (LEVEMIR) 100 UNIT/ML injection Inject 60 Units into the skin in the morning.   isosorbide mononitrate (IMDUR) 30 MG 24 hr tablet TAKE 1 TABLET BY MOUTH EVERY DAY (Patient taking differently: Take 30 mg by mouth daily.)   LEVEMIR FLEXTOUCH 100 UNIT/ML FlexPen Inject into the skin.   metFORMIN (GLUCOPHAGE) 1000 MG tablet Take 1,000 mg by mouth 2 (two) times daily.    metFORMIN (GLUCOPHAGE) 1000 MG tablet Take by mouth.   metoprolol (LOPRESSOR) 50 MG tablet Take 50 mg by mouth 2 (two) times daily.    nitroGLYCERIN (NITROSTAT) 0.4 MG SL tablet Place 1 tablet (0.4 mg total) under the tongue every 5 (five) minutes as needed for chest pain.   NOVOTWIST 30G X 8 MM MISC One needle daily   omeprazole (PRILOSEC) 20 MG capsule Take 20 mg by mouth daily.    OneTouch Delica Lancets 71G MISC Apply topically.   pregabalin (LYRICA) 75 MG capsule Take 75 mg by mouth every evening.    silodosin (RAPAFLO) 8 MG CAPS capsule Take 1 capsule (8 mg total) by mouth in the morning and at bedtime.   torsemide (DEMADEX) 20 MG tablet TAKE 1 TABLET BY MOUTH TWICE A DAY   traMADol (ULTRAM) 50 MG tablet Take 1 tablet (50 mg total) by mouth every 6 (six) hours as needed.   VICTOZA 18 MG/3ML SOPN Inject 1.8 mg into the skin in the morning.    No facility-administered encounter medications on file as of 03/18/2021.    Hypertension   BP goal is:  <130/80  Office blood pressures are  BP Readings from Last 3 Encounters:  12/28/20 112/67  10/08/20 134/72  10/05/20 128/69    Patient is  currently controlled on the following medications: Torsemide 20 mg BID, amlodipine 5 mg, imdur 30 mg, metoprolol 50 mg BID  Patient checks BP at home daily  Patient home BP readings are ranging: 117-147/65-83  We discussed diet and exercise extensively  Plan  Continue current medications and control with diet and exercise   ______________ Visit Information SDOH (Social Determinants of Health) assessments performed:  Yes.  Mr. Vonada was given information about Principle Care Management/Remote Patient Monitoring services today including:  RPM/PCM service includes personalized support from designated clinical staff supervised by his physician, including individualized plan of care and coordination with other care providers 24/7 contact phone numbers for assistance for urgent and routine care needs. Standard insurance, coinsurance, copays and deductibles apply for principle care management only during months in which we provide at least 30 minutes of these services. Most insurances cover these services at 100%, however patients may be responsible for any copay, coinsurance and/or deductible if applicable. This service may help you avoid the need for more expensive face-to-face services. Only one practitioner may furnish and bill the service in a calendar month. The patient may stop PCM/RPM services at any time (effective at the end of the month) by phone call to the office staff.  Patient agreed to services and verbal consent obtained.   Manuela Schwartz, Pharm.D. [facility] [phone] Ext: [phone]

## 2021-04-10 ENCOUNTER — Ambulatory Visit: Payer: Medicare Other | Admitting: Cardiology

## 2021-04-10 NOTE — Progress Notes (Signed)
No show

## 2021-06-09 ENCOUNTER — Other Ambulatory Visit: Payer: Self-pay | Admitting: Cardiology

## 2021-06-09 DIAGNOSIS — I10 Essential (primary) hypertension: Secondary | ICD-10-CM

## 2021-07-01 ENCOUNTER — Ambulatory Visit: Payer: Medicare Other | Admitting: Urology

## 2021-07-01 ENCOUNTER — Other Ambulatory Visit: Payer: Self-pay

## 2021-07-01 ENCOUNTER — Other Ambulatory Visit: Payer: Self-pay | Admitting: Urology

## 2021-07-01 ENCOUNTER — Encounter: Payer: Self-pay | Admitting: Urology

## 2021-07-01 VITALS — BP 127/67 | HR 83

## 2021-07-01 DIAGNOSIS — N2889 Other specified disorders of kidney and ureter: Secondary | ICD-10-CM

## 2021-07-01 DIAGNOSIS — N138 Other obstructive and reflux uropathy: Secondary | ICD-10-CM

## 2021-07-01 DIAGNOSIS — N401 Enlarged prostate with lower urinary tract symptoms: Secondary | ICD-10-CM

## 2021-07-01 DIAGNOSIS — R3912 Poor urinary stream: Secondary | ICD-10-CM

## 2021-07-01 LAB — BLADDER SCAN AMB NON-IMAGING: Scan Result: 727

## 2021-07-01 MED ORDER — SILODOSIN 8 MG PO CAPS: 8.0000 mg | ORAL_CAPSULE | Freq: Two times a day (BID) | ORAL | 11 refills | Status: DC
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
## 2021-07-01 NOTE — Progress Notes (Signed)
post void residual=727
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
## 2021-07-01 NOTE — Progress Notes (Signed)
Surgical Physician Order Form [facility] [place]  * Scheduling expectation : Next Available  *Length of Case: 30 minutes  *MD Preforming Case: Nicolette Bang, MD  *Assistant Needed: no  *Facility Preference: [facility]  *Clearance needed: yes, Dr. Virg[facility]. Patient will need to stop plavix 5 days prior to surgery  *Anticoagulation Instructions: Hold all anticoagulants  *Aspirin Instructions: Ok to continue Aspirin  -Admit type: OUTpatient  -Anesthesia: General  -Use Standing Orders:  NA  *Diagnosis: BPH w/BOO  *Procedure:      Cystoscoyp with Urolift  Additional orders: N/A  -Equipment:  NA -VTE Prophylaxis Standing Order SCDs       Other:   -Standing Lab Orders Per Anesthesia    Lab other: None  -Standing Test orders EKG/Chest x-ray per Anesthesia       Test other:   - Medications:  Ancef 2gm IV  -Other orders:  Ok to proceed with Ancef PCN allergy reviewed  *Post-op visit Date/Instructions:   2 days voiding trial

## 2021-07-01 NOTE — Patient Instructions (Signed)
Prostatic Urethral Lift Prostatic urethral lift is a surgical procedure to treat symptoms of prostate gland enlargement that occurs with age (benign prostatic hypertrophy, BPH). The urethra passes between the two lobes of the prostate. The urethra is the part of the body that drains urine from the bladder. As the prostate enlarges, it can push on the urethra and cause problems with urinating. This procedure involves placing an implant that holds the prostate away from the urethra. The procedure is done using a thin device called a cystoscope. The device is inserted through the tip of the penis and moved up the urethra to the prostate. This is less invasive than other procedures that require an incision. You may have this procedure if: You have symptoms of BPH. Your prostate is not severely enlarged. Medicines to treat BPH are not working or not tolerated. You want to avoid possible sexual side effects from medicines or other procedures that are used to treat BPH. Tell a health care provider about: Any allergies you have. All medicines you are taking, including vitamins, herbs, eye drops, creams, and over-the-counter medicines. Any problems you or family members have had with anesthetic medicines. Any bleeding problems you have. Any surgeries you have had. Any medical conditions you have. What are the risks? Generally, this is a safe procedure. However, problems may occur, including: Bleeding. Infection. Leaking of urine (incontinence). Allergic reactions to medicines. Return of BPH symptoms after 2 years, requiring more treatment. What happens before the procedure? When to stop eating and drinking Follow instructions from your health care provider about what you may eat and drink before your procedure. These may include: 8 hours before your procedure Stop eating most foods. Do not eat meat, fried foods, or fatty foods. Eat only light foods, such as toast or crackers. All liquids are okay  except energy drinks and alcohol. 6 hours before your procedure Stop eating. Drink only clear liquids, such as water, clear fruit juice, black coffee, plain tea, and sports drinks. Do not drink energy drinks or alcohol. 2 hours before your procedure Stop drinking all liquids. You may be allowed to take medicines with small sips of water. If you do not follow your health care provider's instructions, your procedure may be delayed or canceled. Medicines Ask your health care provider about: Changing or stopping your regular medicines. This is especially important if you are taking diabetes medicines or blood thinners. Taking medicines such as aspirin and ibuprofen. These medicines can thin your blood. Do not take these medicines unless your health care provider tells you to take them. Taking over-the-counter medicines, vitamins, herbs, and supplements. Surgery safety Ask your health care provider what steps will be taken to help prevent infection. These steps may include: Removing hair at the surgery site. Washing skin with a germ-killing soap. Taking antibiotic medicine. General instructions Do not use any products that contain nicotine or tobacco for at least 4 weeks before the procedure. These products include cigarettes, chewing tobacco, and vaping devices, such as e-cigarettes. If you need help quitting, ask your health care provider. If you will be going home right after the procedure, plan to have a responsible adult: Take you home from the hospital or clinic. You will not be allowed to drive. Care for you for the time you are told. What happens during the procedure? An IV may be inserted into one of your veins. You will be given one or more of the following: A medicine to help you relax (sedative). A medicine that is  injected into your urethra to numb the area (local anesthetic). A medicine to make you fall asleep (general anesthetic). A cystoscope will be inserted into your penis  and moved through your urethra to your prostate. A device will be inserted through the cystoscope and used to press the lobes of your prostate away from your urethra. Implants will be inserted through the device to hold the lobes of your prostate in the widened position. The device and cystoscope will be removed. The procedure may vary among health care providers and hospitals. What happens after the procedure? Your blood pressure, heart rate, breathing rate, and blood oxygen level be monitored until you leave the hospital or clinic. If you were given a sedative during the procedure, it can affect you for several hours. Do not drive or operate machinery until your health care provider says that it is safe. Summary Prostatic urethral lift is a surgical procedure to relieve symptoms of prostate gland enlargement that occurs with age (benign prostatic hypertrophy, BPH). The procedure is performed with a thin device called a cystoscope. This device is inserted through the tip of the penis and moved up the urethra to reach the prostate. This is less invasive than other procedures that require an incision. If you will be going home right after the procedure, plan to have a responsible adult take you home from the hospital or clinic. You will not be allowed to drive. This information is not intended to replace advice given to you by your health care provider. Make sure you discuss any questions you have with your health care provider. Document Revised: 11/30/2020 Document Reviewed: 11/30/2020 Elsevier Patient Education  [place].

## 2021-07-05 ENCOUNTER — Telehealth: Payer: Self-pay

## 2021-07-05 NOTE — Telephone Encounter (Signed)
I spoke with Mr. Gentzler. We have discussed possible surgery dates and Thursday March 2nd, 2023 was agreed upon by all parties. Patient given information about surgery date, what to expect pre-operatively and post operatively.   We discussed that a Pre-Admission Testing office will be calling to set up the pre-op visit that will take place prior to surgery, and that these appointments are typically done over the phone with a Pre-Admissions RN.   Informed patient that our office will communicate any additional care to be provided after surgery. Patients questions or concerns were discussed during our call. Advised to call our office should there be any additional information, questions or concerns that arise. Patient verbalized understanding.

## 2021-07-05 NOTE — Progress Notes (Signed)
REQUEST FOR SURGICAL CLEARANCE       Date: Date: 07/05/21  Faxed to: Dr. Virgina Jock  Surgeon: Dr. Nicolette Bang, MD     Date of Surgery: Thursday March 2nd, 2023  Operation: Cystoscopy with Insertion of Urolift  Anesthesia Type: General   Diagnosis: BPH with Bladder Outlet Obstruction  Patient Requires:   Cardiac / Vascular Clearance : Yes  Reason: Would like patient to hold Plavix 5 days prior to Surgery  Risk Assessment:    Low   []       Moderate   []     High   []           This patient is optimized for surgery  YES []       NO   []    I recommend further assessment/workup prior to surgery. YES []      NO  []   Appointment scheduled for: _______________________   Further recommendations: ____________________________________     Physician Signature:__________________________________   Printed Name: ________________________________________   Date: _________________

## 2021-07-05 NOTE — Progress Notes (Signed)
[facility] Surgery Posting Form   Surgery Date/Time: Date: 07/18/2021  Surgeon: Dr. Nicolette Bang, MD  Surgery Location: Day Surgery  Inpt ( No  )   Outpt (Yes)   Obs ( No  )   Diagnosis: BPH with Bladder Outlet Obstruction N40.1, N13.8  -CPT: 78676,72094  Surgery: Cystoscopy with insertion of Urolift  Stop Anticoagulations: Yes  Cardiac/Medical/Pulmonary Clearance needed: Yes  Clearance needed from Dr: Virgina Jock  Clearance request sent on: Date: 07/05/21   *Orders entered into EPIC  Date: 07/05/21   *Case booked in EPIC  Date: 07/05/21  *Notified pt of Surgery: Date: 07/05/21  *Placed into Prior Authorization Work Fabio Bering Date: 07/05/21   Assistant/laser/rep:No

## 2021-07-12 ENCOUNTER — Telehealth: Payer: Self-pay

## 2021-07-12 NOTE — Telephone Encounter (Signed)
Hurst, Blake Bowen, MD  Blake Hurst, CMA REQUEST FOR SURGICAL CLEARANCE                                              Date: Date: 07/05/21     Faxed to: Dr. Virgina Jock     Surgeon: Dr. Nicolette Bang, MD         Date of Surgery: Thursday March 2nd, 2023     Operation: Cystoscopy with Insertion of Urolift     Anesthesia Type: General     Diagnosis: BPH with Bladder Outlet Obstruction     Patient Requires:     Cardiac / Vascular Clearance : Yes     Reason: Would like patient to hold Plavix 5 days prior to Surgery      Risk Assessment:  Low                    This patient is optimized for surgery  Yes     I recommend further assessment/workup prior to surgery.   No     Appointment scheduled for: 08/26/2021 with another cardiologist (Dr. Johnsie Cancel)     Further recommendations:  Okay to hold plavix 5 days prior to surgery     Physician Signature: Vernell Leep     Printed Name: Vernell Leep, MD     Date: 07/12/2021

## 2021-07-12 NOTE — Patient Instructions (Signed)
Blake Hurst  07/12/2021     @PREFPERIOPPHARMACY @   Your procedure is scheduled on  07/18/2021.   Report to [facility] at  0900 A.M.   Call this number if you have problems the morning of surgery:  [phone]   Remember:  Do not eat or drink after midnight.      Your last dose of plavix should be on 07/12/2021.     DO NOT take any medications for diabetes the morning of your procedure.    Take these medicines the morning of surgery with A SIP OF WATER                  amlodipine, imdur, metoprolol, prilosec, lyrica.     Do not wear jewelry, make-up or nail polish.  Do not wear lotions, powders, or perfumes, or deodorant.  Do not shave 48 hours prior to surgery.  Men may shave face and neck.  Do not bring valuables to the hospital.  [facility] is not responsible for any belongings or valuables.  Contacts, dentures or bridgework may not be worn into surgery.  Leave your suitcase in the car.  After surgery it may be brought to your room.  For patients admitted to the hospital, discharge time will be determined by your treatment team.  Patients discharged the day of surgery will not be allowed to drive home and must have someone with them for 24 hours.    Special instructions:   DO NOT smoke tobacco or vape for 24 hours before your procedure.  Please read over the following fact sheets that you were given. Coughing and Deep Breathing, Surgical Site Infection Prevention, Anesthesia Post-op Instructions, and Care and Recovery After Surgery        Prostatic Urethral Lift, Care After The following information offers guidance on how to care for yourself after your procedure. Your health care provider may also give you more specific instructions. If you have problems or questions, contact your health care provider. What can I expect after the procedure? After the procedure, it is common to have: Soreness or discomfort in your penis from having the cystoscope  inserted during the procedure. Discomfort or burning when urinating. An increased urge to urinate. More frequent urination. Urine that is blood-tinged. These symptoms should go away after a few days. Follow these instructions at home: Activity  If you were given a sedative during the procedure, it can affect you for several hours. Do not drive or operate machinery until your health care provider says that it is safe. Avoid sitting for a long time without moving. Get up to take short walks every 1-2 hours. This is important to improve blood flow and breathing. Ask for help if you feel weak or unsteady. You may have to avoid lifting. Ask your health care provider how much you can safely lift. Avoid intense physical activity for as long as told by your health care provider. Return to your normal activities as told by your health care provider. Ask your health care provider what activities are safe for you. Ask when you can return to sexual activity. General instructions  Take over-the-counter and prescription medicines only as told by your health care provider. Ask your health care provider if the medicine prescribed to you: Requires you to avoid driving or using machinery. Can cause constipation. You may need to take these actions to prevent or treat constipation: Drink enough fluid to keep your urine pale yellow. Take over-the-counter  or prescription medicines. Eat foods that are high in fiber, such as beans, whole grains, and fresh fruits and vegetables. Limit foods that are high in fat and processed sugars, such as fried or sweet foods. Do not use any products that contain nicotine or tobacco. These products include cigarettes, chewing tobacco, and vaping devices, such as e-cigarettes. These can delay healing after the procedure. If you need help quitting, ask your health care provider. Keep all follow-up visits. This is important. Contact a health care provider if: You have chills or a  fever. You have pain when passing urine. You have bright red blood or blood clots in your urine. You have difficulty passing urine. You have leaking of urine (incontinence). Get help right away if: You have chest pain or shortness of breath. You have leg pain or swelling. You cannot pass urine. These symptoms may be an emergency. Get help right away. Call 911. Do not wait to see if the symptoms will go away. Do not drive yourself to the hospital. Summary After the procedure, it is common to have discomfort or burning when urinating, an increased urge to urinate, more frequent urination, and urine that is blood-tinged. You may have to avoid lifting. Ask your health care provider how much you can safely lift. Return to your normal activities as told by your health care provider. Ask when you can return to sexual activity. This information is not intended to replace advice given to you by your health care provider. Make sure you discuss any questions you have with your health care provider. Document Revised: 11/30/2020 Document Reviewed: 11/30/2020 Elsevier Patient Education  [name] Anesthesia, Adult, Care After This sheet gives you information about how to care for yourself after your procedure. Your health care provider may also give you more specific instructions. If you have problems or questions, contact your health care provider. What can I expect after the procedure? After the procedure, the following side effects are common: Pain or discomfort at the IV site. Nausea. Vomiting. Sore throat. Trouble concentrating. Feeling cold or chills. Feeling weak or tired. Sleepiness and fatigue. Soreness and body aches. These side effects can affect parts of the body that were not involved in surgery. Follow these instructions at home: For the time period you were told by your health care provider:  Rest. Do not participate in activities where you could fall or become  injured. Do not drive or use machinery. Do not drink alcohol. Do not take sleeping pills or medicines that cause drowsiness. Do not make important decisions or sign legal documents. Do not take care of children on your own. Eating and drinking Follow any instructions from your health care provider about eating or drinking restrictions. When you feel hungry, start by eating small amounts of foods that are soft and easy to digest (bland), such as toast. Gradually return to your regular diet. Drink enough fluid to keep your urine pale yellow. If you vomit, rehydrate by drinking water, juice, or clear broth. General instructions If you have sleep apnea, surgery and certain medicines can increase your risk for breathing problems. Follow instructions from your health care provider about wearing your sleep device: Anytime you are sleeping, including during daytime naps. While taking prescription pain medicines, sleeping medicines, or medicines that make you drowsy. Have a responsible adult stay with you for the time you are told. It is important to have someone help care for you until you are awake and alert. Return to your normal  activities as told by your health care provider. Ask your health care provider what activities are safe for you. Take over-the-counter and prescription medicines only as told by your health care provider. If you smoke, do not smoke without supervision. Keep all follow-up visits as told by your health care provider. This is important. Contact a health care provider if: You have nausea or vomiting that does not get better with medicine. You cannot eat or drink without vomiting. You have pain that does not get better with medicine. You are unable to pass urine. You develop a skin rash. You have a fever. You have redness around your IV site that gets worse. Get help right away if: You have difficulty breathing. You have chest pain. You have blood in your urine or stool,  or you vomit blood. Summary After the procedure, it is common to have a sore throat or nausea. It is also common to feel tired. Have a responsible adult stay with you for the time you are told. It is important to have someone help care for you until you are awake and alert. When you feel hungry, start by eating small amounts of foods that are soft and easy to digest (bland), such as toast. Gradually return to your regular diet. Drink enough fluid to keep your urine pale yellow. Return to your normal activities as told by your health care provider. Ask your health care provider what activities are safe for you. This information is not intended to replace advice given to you by your health care provider. Make sure you discuss any questions you have with your health care provider. Document Revised: 01/19/2020 Document Reviewed: 08/18/2019 Elsevier Patient Education  2022 [employer]. How to Use Chlorhexidine for Bathing Chlorhexidine gluconate (CHG) is a germ-killing (antiseptic) solution that is used to clean the skin. It can get rid of the bacteria that normally live on the skin and can keep them away for about 24 hours. To clean your skin with CHG, you may be given: A CHG solution to use in the shower or as part of a sponge bath. A prepackaged cloth that contains CHG. Cleaning your skin with CHG may help lower the risk for infection: While you are staying in the intensive care unit of the hospital. If you have a vascular access, such as a central line, to provide short-term or long-term access to your veins. If you have a catheter to drain urine from your bladder. If you are on a ventilator. A ventilator is a machine that helps you breathe by moving air in and out of your lungs. After surgery. What are the risks? Risks of using CHG include: A skin reaction. Hearing loss, if CHG gets in your ears and you have a perforated eardrum. Eye injury, if CHG gets in your eyes and is not rinsed  out. The CHG product catching fire. Make sure that you avoid smoking and flames after applying CHG to your skin. Do not use CHG: If you have a chlorhexidine allergy or have previously reacted to chlorhexidine. On babies younger than 85 months of age. How to use CHG solution Use CHG only as told by your health care provider, and follow the instructions on the label. Use the full amount of CHG as directed. Usually, this is one bottle. During a shower Follow these steps when using CHG solution during a shower (unless your health care provider gives you different instructions): Start the shower. Use your normal soap and shampoo to wash your face  and hair. Turn off the shower or move out of the shower stream. Pour the CHG onto a clean washcloth. Do not use any type of brush or rough-edged sponge. Starting at your neck, lather your body down to your toes. Make sure you follow these instructions: If you will be having surgery, pay special attention to the part of your body where you will be having surgery. Scrub this area for at least 1 minute. Do not use CHG on your head or face. If the solution gets into your ears or eyes, rinse them well with water. Avoid your genital area. Avoid any areas of skin that have broken skin, cuts, or scrapes. Scrub your back and under your arms. Make sure to wash skin folds. Let the lather sit on your skin for 1-2 minutes or as long as told by your health care provider. Thoroughly rinse your entire body in the shower. Make sure that all body creases and crevices are rinsed well. Dry off with a clean towel. Do not put any substances on your body afterward--such as powder, lotion, or perfume--unless you are told to do so by your health care provider. Only use lotions that are recommended by the manufacturer. Put on clean clothes or pajamas. If it is the night before your surgery, sleep in clean sheets.  During a sponge bath Follow these steps when using CHG solution  during a sponge bath (unless your health care provider gives you different instructions): Use your normal soap and shampoo to wash your face and hair. Pour the CHG onto a clean washcloth. Starting at your neck, lather your body down to your toes. Make sure you follow these instructions: If you will be having surgery, pay special attention to the part of your body where you will be having surgery. Scrub this area for at least 1 minute. Do not use CHG on your head or face. If the solution gets into your ears or eyes, rinse them well with water. Avoid your genital area. Avoid any areas of skin that have broken skin, cuts, or scrapes. Scrub your back and under your arms. Make sure to wash skin folds. Let the lather sit on your skin for 1-2 minutes or as long as told by your health care provider. Using a different clean, wet washcloth, thoroughly rinse your entire body. Make sure that all body creases and crevices are rinsed well. Dry off with a clean towel. Do not put any substances on your body afterward--such as powder, lotion, or perfume--unless you are told to do so by your health care provider. Only use lotions that are recommended by the manufacturer. Put on clean clothes or pajamas. If it is the night before your surgery, sleep in clean sheets. How to use CHG prepackaged cloths Only use CHG cloths as told by your health care provider, and follow the instructions on the label. Use the CHG cloth on clean, dry skin. Do not use the CHG cloth on your head or face unless your health care provider tells you to. When washing with the CHG cloth: Avoid your genital area. Avoid any areas of skin that have broken skin, cuts, or scrapes. Before surgery Follow these steps when using a CHG cloth to clean before surgery (unless your health care provider gives you different instructions): Using the CHG cloth, vigorously scrub the part of your body where you will be having surgery. Scrub using a  back-and-forth motion for 3 minutes. The area on your body should be completely wet with  CHG when you are done scrubbing. Do not rinse. Discard the cloth and let the area air-dry. Do not put any substances on the area afterward, such as powder, lotion, or perfume. Put on clean clothes or pajamas. If it is the night before your surgery, sleep in clean sheets.  For general bathing Follow these steps when using CHG cloths for general bathing (unless your health care provider gives you different instructions). Use a separate CHG cloth for each area of your body. Make sure you wash between any folds of skin and between your fingers and toes. Wash your body in the following order, switching to a new cloth after each step: The front of your neck, shoulders, and chest. Both of your arms, under your arms, and your hands. Your stomach and groin area, avoiding the genitals. Your right leg and foot. Your left leg and foot. The back of your neck, your back, and your buttocks. Do not rinse. Discard the cloth and let the area air-dry. Do not put any substances on your body afterward--such as powder, lotion, or perfume--unless you are told to do so by your health care provider. Only use lotions that are recommended by the manufacturer. Put on clean clothes or pajamas. Contact a health care provider if: Your skin gets irritated after scrubbing. You have questions about using your solution or cloth. You swallow any chlorhexidine. Call your local poison control center (1-661-562-0779 in the [place] Get help right away if: Your eyes itch badly, or they become very red or swollen. Your skin itches badly and is red or swollen. Your hearing changes. You have trouble seeing. You have swelling or tingling in your mouth or throat. You have trouble breathing. These symptoms may represent a serious problem that is an emergency. Do not wait to see if the symptoms will go away. Get medical help right away. Call your  local emergency services (911 in the [place] Do not drive yourself to the hospital. Summary Chlorhexidine gluconate (CHG) is a germ-killing (antiseptic) solution that is used to clean the skin. Cleaning your skin with CHG may help to lower your risk for infection. You may be given CHG to use for bathing. It may be in a bottle or in a prepackaged cloth to use on your skin. Carefully follow your health care provider's instructions and the instructions on the product label. Do not use CHG if you have a chlorhexidine allergy. Contact your health care provider if your skin gets irritated after scrubbing. This information is not intended to replace advice given to you by your health care provider. Make sure you discuss any questions you have with your health care provider. Document Revised: 07/16/2020 Document Reviewed: 07/16/2020 Elsevier Patient Education  2022 [employer].

## 2021-07-16 ENCOUNTER — Encounter (HOSPITAL_COMMUNITY)
Admission: RE | Admit: 2021-07-16 | Discharge: 2021-07-16 | Disposition: A | Discharge status: Home / Self Care | Payer: Medicare Other | Source: Ambulatory Visit | Attending: Urology | Admitting: Urology

## 2021-07-16 VITALS — BP 136/69 | HR 81 | Temp 97.8°F | Resp 18 | Ht 73.0 in | Wt 290.0 lb

## 2021-07-16 DIAGNOSIS — Z01812 Encounter for preprocedural laboratory examination: Secondary | ICD-10-CM | POA: Diagnosis not present

## 2021-07-16 DIAGNOSIS — E119 Type 2 diabetes mellitus without complications: Secondary | ICD-10-CM | POA: Diagnosis not present

## 2021-07-16 DIAGNOSIS — Z794 Long term (current) use of insulin: Secondary | ICD-10-CM | POA: Insufficient documentation

## 2021-07-16 LAB — BASIC METABOLIC PANEL
Anion gap: 13 (ref 5–15)
BUN: 21 mg/dL (ref 8–23)
CO2: 25 mmol/L (ref 22–32)
Calcium: 9.7 mg/dL (ref 8.9–10.3)
Chloride: 100 mmol/L (ref 98–111)
Creatinine, Ser: 1.46 mg/dL — ABNORMAL HIGH (ref 0.61–1.24)
GFR, Estimated: 48 mL/min — ABNORMAL LOW (ref >60)
Glucose, Bld: 182 mg/dL — ABNORMAL HIGH (ref 70–99)
Potassium: 3.7 mmol/L (ref 3.5–5.1)
Sodium: 138 mmol/L (ref 135–145)

## 2021-07-16 LAB — HEMOGLOBIN A1C
Hgb A1c MFr Bld: 7.3 % — ABNORMAL HIGH (ref 4.8–5.6)
Mean Plasma Glucose: 162.81 mg/dL

## 2021-07-18 ENCOUNTER — Encounter (HOSPITAL_COMMUNITY)
Admission: RE | Disposition: A | Discharge status: Home / Self Care | Payer: Self-pay | Source: Home / Self Care | Attending: Urology

## 2021-07-18 ENCOUNTER — Ambulatory Visit (HOSPITAL_COMMUNITY)
Admission: RE | Admit: 2021-07-18 | Discharge: 2021-07-18 | Disposition: A | Discharge status: Home / Self Care | Payer: Medicare Other | Attending: Urology | Admitting: Urology

## 2021-07-18 ENCOUNTER — Ambulatory Visit (HOSPITAL_COMMUNITY): Payer: Medicare Other | Admitting: Anesthesiology

## 2021-07-18 ENCOUNTER — Ambulatory Visit (HOSPITAL_BASED_OUTPATIENT_CLINIC_OR_DEPARTMENT_OTHER): Payer: Medicare Other | Admitting: Anesthesiology

## 2021-07-18 DIAGNOSIS — I10 Essential (primary) hypertension: Secondary | ICD-10-CM | POA: Insufficient documentation

## 2021-07-18 DIAGNOSIS — N138 Other obstructive and reflux uropathy: Secondary | ICD-10-CM | POA: Diagnosis not present

## 2021-07-18 DIAGNOSIS — Z87891 Personal history of nicotine dependence: Secondary | ICD-10-CM | POA: Insufficient documentation

## 2021-07-18 DIAGNOSIS — N320 Bladder-neck obstruction: Secondary | ICD-10-CM | POA: Diagnosis not present

## 2021-07-18 DIAGNOSIS — K219 Gastro-esophageal reflux disease without esophagitis: Secondary | ICD-10-CM | POA: Insufficient documentation

## 2021-07-18 DIAGNOSIS — N400 Enlarged prostate without lower urinary tract symptoms: Secondary | ICD-10-CM

## 2021-07-18 DIAGNOSIS — I252 Old myocardial infarction: Secondary | ICD-10-CM | POA: Diagnosis not present

## 2021-07-18 DIAGNOSIS — I25119 Atherosclerotic heart disease of native coronary artery with unspecified angina pectoris: Secondary | ICD-10-CM | POA: Diagnosis not present

## 2021-07-18 DIAGNOSIS — Z7984 Long term (current) use of oral hypoglycemic drugs: Secondary | ICD-10-CM | POA: Diagnosis not present

## 2021-07-18 DIAGNOSIS — E1151 Type 2 diabetes mellitus with diabetic peripheral angiopathy without gangrene: Secondary | ICD-10-CM | POA: Insufficient documentation

## 2021-07-18 DIAGNOSIS — I2510 Atherosclerotic heart disease of native coronary artery without angina pectoris: Secondary | ICD-10-CM | POA: Insufficient documentation

## 2021-07-18 DIAGNOSIS — N401 Enlarged prostate with lower urinary tract symptoms: Secondary | ICD-10-CM | POA: Diagnosis not present

## 2021-07-18 HISTORY — PX: CYSTOSCOPY WITH INSERTION OF UROLIFT: SHX6678

## 2021-07-18 LAB — GLUCOSE, CAPILLARY: Glucose-Capillary: 157 mg/dL — ABNORMAL HIGH (ref 70–99)

## 2021-07-18 SURGERY — CYSTOSCOPY WITH INSERTION OF UROLIFT
Anesthesia: General | Site: Prostate

## 2021-07-18 MED ORDER — CEFAZOLIN SODIUM-DEXTROSE 2-4 GM/100ML-% IV SOLN: 2.0000 g | INTRAVENOUS | Status: DC

## 2021-07-18 MED ORDER — ONDANSETRON HCL 4 MG/2ML IJ SOLN
INTRAMUSCULAR | Status: DC | PRN
  Administered 2021-07-18: 4 mg via INTRAVENOUS

## 2021-07-18 MED ORDER — WATER FOR IRRIGATION, STERILE IR SOLN
Status: DC | PRN
Start: 2021-07-18 — End: 2021-07-18
  Administered 2021-07-18: 3000 mL

## 2021-07-18 MED ORDER — CHLORHEXIDINE GLUCONATE 0.12 % MT SOLN
15.0000 mL | Freq: Once | OROMUCOSAL | Status: AC
  Administered 2021-07-18: 15 mL via OROMUCOSAL

## 2021-07-18 MED ORDER — LIDOCAINE HCL URETHRAL/MUCOSAL 2 % EX GEL
CUTANEOUS | Status: AC
  Filled 2021-07-18: qty 10

## 2021-07-18 MED ORDER — PHENYLEPHRINE HCL (PRESSORS) 10 MG/ML IV SOLN
INTRAVENOUS | Status: DC | PRN
  Administered 2021-07-18: 100 ug via INTRAVENOUS

## 2021-07-18 MED ORDER — ROCURONIUM BROMIDE 10 MG/ML (PF) SYRINGE
PREFILLED_SYRINGE | INTRAVENOUS | Status: AC
Start: 2021-07-18 — End: ?
  Filled 2021-07-18: qty 10

## 2021-07-18 MED ORDER — SUGAMMADEX SODIUM 500 MG/5ML IV SOLN
INTRAVENOUS | Status: DC | PRN
Start: 2021-07-18 — End: 2021-07-18
  Administered 2021-07-18: 400 mg via INTRAVENOUS

## 2021-07-18 MED ORDER — DIATRIZOATE MEGLUMINE 30 % UR SOLN
URETHRAL | Status: AC
  Filled 2021-07-18: qty 100

## 2021-07-18 MED ORDER — FENTANYL CITRATE (PF) 100 MCG/2ML IJ SOLN
INTRAMUSCULAR | Status: DC | PRN
  Administered 2021-07-18 (×2): 100 ug via INTRAVENOUS
  Administered 2021-07-18: 50 ug via INTRAVENOUS

## 2021-07-18 MED ORDER — LIDOCAINE HCL (CARDIAC) PF 100 MG/5ML IV SOSY
PREFILLED_SYRINGE | INTRAVENOUS | Status: DC | PRN
  Administered 2021-07-18: 50 mg via INTRAVENOUS

## 2021-07-18 MED ORDER — TRAMADOL HCL 50 MG PO TABS: 50.0000 mg | ORAL_TABLET | Freq: Four times a day (QID) | ORAL | 0 refills | Status: AC | PRN

## 2021-07-18 MED ORDER — LACTATED RINGERS IV SOLN: INTRAVENOUS | Status: DC

## 2021-07-18 MED ORDER — ORAL CARE MOUTH RINSE: 15.0000 mL | Freq: Once | OROMUCOSAL | Status: AC

## 2021-07-18 MED ORDER — ONDANSETRON HCL 4 MG/2ML IJ SOLN: 4.0000 mg | Freq: Once | INTRAMUSCULAR | Status: DC | PRN

## 2021-07-18 MED ORDER — FENTANYL CITRATE (PF) 250 MCG/5ML IJ SOLN
INTRAMUSCULAR | Status: AC
  Filled 2021-07-18: qty 5

## 2021-07-18 MED ORDER — PHENYLEPHRINE 40 MCG/ML (10ML) SYRINGE FOR IV PUSH (FOR BLOOD PRESSURE SUPPORT)
PREFILLED_SYRINGE | INTRAVENOUS | Status: AC
Start: 2021-07-18 — End: ?
  Filled 2021-07-18: qty 20

## 2021-07-18 MED ORDER — FENTANYL CITRATE PF 50 MCG/ML IJ SOSY: 25.0000 ug | PREFILLED_SYRINGE | INTRAMUSCULAR | Status: DC | PRN

## 2021-07-18 MED ORDER — LIDOCAINE HCL (PF) 2 % IJ SOLN
INTRAMUSCULAR | Status: AC
  Filled 2021-07-18: qty 5

## 2021-07-18 MED ORDER — ROCURONIUM BROMIDE 100 MG/10ML IV SOLN
INTRAVENOUS | Status: DC | PRN
Start: 2021-07-18 — End: 2021-07-18
  Administered 2021-07-18: 60 mg via INTRAVENOUS

## 2021-07-18 MED ORDER — PROPOFOL 10 MG/ML IV BOLUS
INTRAVENOUS | Status: DC | PRN
Start: 2021-07-18 — End: 2021-07-18
  Administered 2021-07-18: 150 mg via INTRAVENOUS
  Administered 2021-07-18: 50 mg via INTRAVENOUS

## 2021-07-18 MED ORDER — ONDANSETRON HCL 4 MG/2ML IJ SOLN
INTRAMUSCULAR | Status: AC
Start: 2021-07-18 — End: ?
  Filled 2021-07-18: qty 2

## 2021-07-18 MED ORDER — CEFAZOLIN IN SODIUM CHLORIDE 3-0.9 GM/100ML-% IV SOLN
3.0000 g | INTRAVENOUS | Status: AC
  Administered 2021-07-18: 2 g via INTRAVENOUS
  Filled 2021-07-18: qty 100

## 2021-07-18 SURGICAL SUPPLY — 18 items
BAG DRAIN URO TABLE W/ADPT NS (BAG) ×2 IMPLANT
BAG DRN 8 ADPR NS SKTRN CSTL (BAG) ×1
BAG HAMPER (MISCELLANEOUS) ×2 IMPLANT
CLOTH BEACON ORANGE TIMEOUT ST (SAFETY) ×2 IMPLANT
GLOVE SURG POLYISO LF SZ8 (GLOVE) ×2 IMPLANT
GLOVE SURG UNDER POLY LF SZ7 (GLOVE) ×4 IMPLANT
GOWN STRL REUS W/TWL LRG LVL3 (GOWN DISPOSABLE) ×2 IMPLANT
GOWN STRL REUS W/TWL XL LVL3 (GOWN DISPOSABLE) ×2 IMPLANT
KIT TURNOVER CYSTO (KITS) ×2 IMPLANT
MANIFOLD NEPTUNE II (INSTRUMENTS) ×2 IMPLANT
PACK CYSTO (CUSTOM PROCEDURE TRAY) ×2 IMPLANT
PAD ARMBOARD 7.5X6 YLW CONV (MISCELLANEOUS) ×2 IMPLANT
SYSTEM UROLIFT (Male Continence) ×6 IMPLANT
TOWEL OR 17X26 4PK STRL BLUE (TOWEL DISPOSABLE) ×2 IMPLANT
TRAY FOLEY W/BAG SLVR 16FR (SET/KITS/TRAYS/PACK) ×2
TRAY FOLEY W/BAG SLVR 16FR ST (SET/KITS/TRAYS/PACK) ×1 IMPLANT
WATER STERILE IRR 3000ML UROMA (IV SOLUTION) ×2 IMPLANT
WATER STERILE IRR 500ML POUR (IV SOLUTION) ×2 IMPLANT

## 2021-07-18 NOTE — Anesthesia Postprocedure Evaluation (Signed)
Anesthesia Post Note ? ?Patient: Blake Hurst ? ?Procedure(s) Performed: CYSTOSCOPY WITH INSERTION OF UROLIFT (Prostate) ? ?Patient location during evaluation: Phase II ?Anesthesia Type: General ?Level of consciousness: awake and alert and oriented ?Pain management: pain level controlled ?Vital Signs Assessment: post-procedure vital signs reviewed and stable ?Respiratory status: spontaneous breathing, nonlabored ventilation and respiratory function stable ?Cardiovascular status: blood pressure returned to baseline and stable ?Postop Assessment: no apparent nausea or vomiting ?Anesthetic complications: no ? ? ?No notable events documented. ? ? ?Last Vitals:  ?Vitals:  ? 07/18/21 1100 07/18/21 1129  ?BP: (!) 107/55 (!) 117/59  ?Pulse: 68 65  ?Resp: 16 14  ?Temp:  36.4 ?C  ?SpO2: 95% 95%  ?  ?Last Pain:  ?Vitals:  ? 07/18/21 1129  ?TempSrc: Oral  ?PainSc: 0-No pain  ? ? ?  ?  ?  ?  ?  ?  ? ?Blake Hurst ? ? ? ? ?

## 2021-07-18 NOTE — Interval H&P Note (Signed)
History and Physical Interval Note: ? ?07/18/2021 ?9:38 AM ? 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  The various methods of treatment have been discussed with the patient and family. After consideration of risks, benefits and other options for treatment, the patient has consented to  Procedure(s): ?CYSTOSCOPY WITH INSERTION OF UROLIFT (N/A) as a surgical intervention.  The patient's history has been reviewed, patient examined, no change in status, stable for surgery.  I have reviewed the patient's chart and labs.  Questions were answered to the patient's satisfaction.   ? ? ?Nicolette Bang ? ? ?

## 2021-07-18 NOTE — Op Note (Signed)
? ?  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 ? 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 ? ?PROCEDURE:  Cystoscopy with implantation of UroLift devices, 6 implants. ? ?SURGEON:  Nicolette Bang, M.D. ? ?ANESTHESIA:  General ? ?ANTIBIOTICS: ancef ? ?SPECIMEN:  None. ? ?DRAINS:  A 16-French Foley catheter. ? ?BLOOD LOSS:  Minimal. ? ?COMPLICATIONS:  None. ? ?INDICATIONS: The Patient is an 81 year old male with BPH and ?bladder outlet obstruction.  He has failed medical therapy and has ?elected UroLift for definitive treatment. ? ?FINDINGS OF PROCEDURE:  He was taken to the operating room where a ?general anesthetic was induced.  He was placed in ?lithotomy position and was fitted with PAS hose.  His perineum and ?genitalia were prepped with chlorhexidine, and he was draped in usual ?sterile fashion. ? ?Cystoscopy was performed using the UroLift scope and 0 degree lens. ?Examination revealed a normal urethra.  The external sphincter was ?intact.  Prostatic urethra was approximately 5 cm in length with lateral ?lobe enlargement. There was also little bit of bladder neck elevation. ?Inspection of bladder revealed mild-to-moderate trabeculation with no ?tumors, stones, or inflammation.  No cellules or diverticula were noted. ?Ureteral orifices were in their normal anatomic position effluxing clear ?urine. ? ?After initial cystoscopy, the visual obturator was replaced with the ?first UroLift device.  This was turned to the 9 o'clock position and ?pulled back to the veru and then slightly advanced.  Pressure was ?then applied to the right lateral lobe and the UroLift device was ?deployed. ? ?The second UroLift device was then inserted and applied to the left ?lateral lobe at 3 o'clock and deployed in the mid prostatic urethra. ?After this, there was still some apparent obstruction closer to the ?bladder neck.  So a second and  third level of UroLift evice was applied ?between the mid urethra and the proximal urethra providing further ?patency to the prostatic urethra.   The scope was removed and ?a 16-French Foley catheter was inserted without difficulty. The balloon ?was filled with 10 mL sterile fluid, and the catheter was placed to ?straight drainage. ? ?COMPLICATIONS: None  ? ?CONDITION: Stable, extubated, transferred to PACU ? ?PLAN: The patient will be discharged home and followup in 2 days for a voiding trial.  ? ?

## 2021-07-18 NOTE — Anesthesia Preprocedure Evaluation (Addendum)
Anesthesia Evaluation  ?Patient identified by MRN, date of birth, ID band ?Patient awake ? ? ? ?Reviewed: ?Allergy & Precautions, NPO status , Patient's Chart, lab work & pertinent test results, reviewed documented beta blocker date and time  ? ?Airway ?Mallampati: III ? ?TM Distance: >3 FB ?Neck ROM: Full ? ? ? Dental ? ?(+) Dental Advisory Given, Edentulous Upper, Missing ?  ?Pulmonary ?neg pulmonary ROS, former smoker,  ?  ?Pulmonary exam normal ?breath sounds clear to auscultation ? ? ? ? ? ? Cardiovascular ?Exercise Tolerance: Good ?hypertension, Pt. on home beta blockers and Pt. on medications ?+ angina + CAD, + Past MI and + Peripheral Vascular Disease  ?Normal cardiovascular exam+ Valvular Problems/Murmurs  ?Rhythm:Regular Rate:Normal ? ? ?  ?Neuro/Psych ? Neuromuscular disease negative psych ROS  ? GI/Hepatic ?Neg liver ROS, GERD  Medicated and Controlled,  ?Endo/Other  ?diabetes, Well Controlled, Type 2, Oral Hypoglycemic Agents ? Renal/GU ?Renal InsufficiencyRenal disease  ?negative genitourinary ?  ?Musculoskeletal ? ?(+) Arthritis , Osteoarthritis,   ? Abdominal ?  ?Peds ?negative pediatric ROS ?(+)  Hematology ?negative hematology ROS ?(+)   ?Anesthesia Other Findings ? ? Reproductive/Obstetrics ?negative OB ROS ? ?  ? ? ? ? ? ? ? ? ? ? ? ? ? ?  ?  ? ? ? ? ? ? ? ?Anesthesia Physical ?Anesthesia Plan ? ?ASA: 3 ? ?Anesthesia Plan: General  ? ?Post-op Pain Management: Minimal or no pain anticipated  ? ?Induction: Intravenous ? ?PONV Risk Score and Plan: 4 or greater and Ondansetron ? ?Airway Management Planned: Oral ETT ? ?Additional Equipment:  ? ?Intra-op Plan:  ? ?Post-operative Plan: Extubation in OR ? ?Informed Consent: I have reviewed the patients History and Physical, chart, labs and discussed the procedure including the risks, benefits and alternatives for the proposed anesthesia with the patient or authorized representative who has indicated his/her understanding  and acceptance.  ? ? ? ?Dental advisory given ? ?Plan Discussed with: CRNA and Surgeon ? ?Anesthesia Plan Comments:   ? ? ? ? ? ?Anesthesia Quick Evaluation ? ?

## 2021-07-18 NOTE — Transfer of Care (Signed)
Immediate Anesthesia Transfer of Care Note ? ?Patient: Kallan Merrick ? ?Procedure(s) Performed: CYSTOSCOPY WITH INSERTION OF UROLIFT ? ?Patient Location: PACU ? ?Anesthesia Type:General ? ?Level of Consciousness: awake, alert , oriented and patient cooperative ? ?Airway & Oxygen Therapy: Patient Spontanous Breathing and Patient connected to nasal cannula oxygen ? ?Post-op Assessment: Report given to RN, Post -op Vital signs reviewed and stable and Patient moving all extremities X 4 ? ?Post vital signs: Reviewed and stable ? ?Last Vitals:  ?Vitals Value Taken Time  ?BP    ?Temp    ?Pulse    ?Resp    ?SpO2    ? ? ?Last Pain:  ?Vitals:  ? 07/18/21 0925  ?TempSrc: Oral  ?PainSc: 5   ?   ? ?Patients Stated Pain Goal: 7 (07/18/21 0925) ? ?Complications: No notable events documented. ?

## 2021-07-18 NOTE — Anesthesia Procedure Notes (Signed)
Procedure Name: Intubation ?Date/Time: 07/18/2021 10:04 AM ?Performed by: Jonna Munro, CRNA ?Pre-anesthesia Checklist: Patient identified, Emergency Drugs available, Suction available, Patient being monitored and Timeout performed ?Patient Re-evaluated:Patient Re-evaluated prior to induction ?Oxygen Delivery Method: Circle system utilized ?Preoxygenation: Pre-oxygenation with 100% oxygen ?Induction Type: IV induction ?Ventilation: Mask ventilation without difficulty ?Laryngoscope Size: Mac and 3 ?Grade View: Grade I ?Tube type: Oral ?Tube size: 7.5 mm ?Number of attempts: 1 ?Airway Equipment and Method: Stylet ?Placement Confirmation: ETT inserted through vocal cords under direct vision, positive ETCO2 and breath sounds checked- equal and bilateral ?Secured at: 23 cm ?Tube secured with: Tape ?Dental Injury: Teeth and Oropharynx as per pre-operative assessment  ? ? ? ? ?

## 2021-07-23 ENCOUNTER — Encounter (HOSPITAL_COMMUNITY): Payer: Self-pay | Admitting: Urology

## 2021-07-24 ENCOUNTER — Other Ambulatory Visit: Payer: Self-pay

## 2021-07-24 ENCOUNTER — Ambulatory Visit: Payer: Medicare Other | Admitting: Physician Assistant

## 2021-07-24 VITALS — BP 112/69 | HR 75 | Ht 73.0 in | Wt 280.0 lb

## 2021-07-24 DIAGNOSIS — N138 Other obstructive and reflux uropathy: Secondary | ICD-10-CM

## 2021-07-24 DIAGNOSIS — N401 Enlarged prostate with lower urinary tract symptoms: Secondary | ICD-10-CM

## 2021-07-24 DIAGNOSIS — R339 Retention of urine, unspecified: Secondary | ICD-10-CM

## 2021-07-24 DIAGNOSIS — R600 Localized edema: Secondary | ICD-10-CM

## 2021-07-24 LAB — BLADDER SCAN AMB NON-IMAGING: Scan Result: 44

## 2021-07-24 MED ORDER — MICONAZOLE NITRATE 2 % EX CREA: 1.0000 "application " | TOPICAL_CREAM | Freq: Two times a day (BID) | CUTANEOUS | 0 refills | Status: DC
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
## 2021-07-27 ENCOUNTER — Other Ambulatory Visit: Payer: Self-pay | Admitting: Urology

## 2021-07-27 DIAGNOSIS — N138 Other obstructive and reflux uropathy: Secondary | ICD-10-CM

## 2021-07-27 DIAGNOSIS — N401 Enlarged prostate with lower urinary tract symptoms: Secondary | ICD-10-CM

## 2021-07-29 ENCOUNTER — Other Ambulatory Visit: Payer: Self-pay | Admitting: Urology

## 2021-07-29 DIAGNOSIS — N138 Other obstructive and reflux uropathy: Secondary | ICD-10-CM

## 2021-07-29 DIAGNOSIS — N401 Enlarged prostate with lower urinary tract symptoms: Secondary | ICD-10-CM

## 2021-08-01 NOTE — Addendum Note (Signed)
Addended byIris Pert on: 08/01/2021 09:40 AM ? ? Modules accepted: Orders ? ?

## 2021-08-01 NOTE — Progress Notes (Signed)
Fill and Pull Catheter Removal ? ?Patient is present today for a catheter removal.  Patient was cleaned and prepped in a sterile fashion 30 ml of sterile water/ saline was instilled into the bladder when the patient began to have bladder spasms. After several attempts to instill water failed.    A 16FR foley cath was removed from the bladder no complications were noted .  Patient was then release to go and come back for a PVR after lunch.   Patient tolerated well. ? ?Performed by: Zettie Gootee LPN  ? ?Follow up/ Additional notes: This afternoon for pvr ? ? ?Patient returned stating he drunk two 16 oz. Bottles of water and has not been able to void. Patient states he does not feel a strong urge to void. ? ?post void residual=44 ?

## 2021-08-20 ENCOUNTER — Other Ambulatory Visit: Payer: Self-pay | Admitting: Interventional Radiology

## 2021-08-20 DIAGNOSIS — N2889 Other specified disorders of kidney and ureter: Secondary | ICD-10-CM
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
## 2021-08-26 ENCOUNTER — Encounter: Payer: Self-pay | Admitting: Cardiovascular Disease

## 2021-08-26 ENCOUNTER — Ambulatory Visit: Payer: Medicare Other | Admitting: Cardiovascular Disease

## 2021-08-26 VITALS — BP 124/60 | HR 70 | Ht 73.0 in | Wt 284.0 lb

## 2021-08-26 DIAGNOSIS — I10 Essential (primary) hypertension: Secondary | ICD-10-CM

## 2021-08-26 DIAGNOSIS — E782 Mixed hyperlipidemia: Secondary | ICD-10-CM | POA: Diagnosis not present

## 2021-08-26 DIAGNOSIS — I25708 Atherosclerosis of coronary artery bypass graft(s), unspecified, with other forms of angina pectoris: Secondary | ICD-10-CM

## 2021-08-26 NOTE — Patient Instructions (Signed)
Medication Instructions:  Your physician recommends that you continue on your current medications as directed. Please refer to the Current Medication list given to you today.  *If you need a refill on your cardiac medications before your next appointment, please call your pharmacy*   Lab Work: NONE   If you have labs (blood work) drawn today and your tests are completely normal, you will receive your results only by: MyChart Message (if you have MyChart) OR A paper copy in the mail If you have any lab test that is abnormal or we need to change your treatment, we will call you to review the results.   Testing/Procedures: NONE    Follow-Up: At [facility], you and your health needs are our priority.  As part of our continuing mission to provide you with exceptional heart care, we have created designated Provider Care Teams.  These Care Teams include your primary Cardiologist (physician) and Advanced Practice Providers (APPs -  Physician Assistants and Nurse Practitioners) who all work together to provide you with the care you need, when you need it.  We recommend signing up for the patient portal called "MyChart".  Sign up information is provided on this After Visit Summary.  MyChart is used to connect with patients for Virtual Visits (Telemedicine).  Patients are able to view lab/test results, encounter notes, upcoming appointments, etc.  Non-urgent messages can be sent to your provider as well.   To learn more about what you can do with MyChart, go to [email or web address].    Your next appointment:   6 month(s)  The format for your next appointment:   In Person  Provider:   Peter Nishan, MD    Other Instructions Thank you for choosing [facility]!    Important Information About Sugar       

## 2021-09-13 ENCOUNTER — Ambulatory Visit (HOSPITAL_COMMUNITY)
Admission: RE | Admit: 2021-09-13 | Discharge: 2021-09-13 | Disposition: A | Discharge status: Home / Self Care | Payer: Medicare Other | Source: Ambulatory Visit | Attending: Interventional Radiology | Admitting: Interventional Radiology

## 2021-09-13 DIAGNOSIS — N2889 Other specified disorders of kidney and ureter: Secondary | ICD-10-CM

## 2021-09-13 MED ORDER — GADOBUTROL 1 MMOL/ML IV SOLN
10.0000 mL | Freq: Once | INTRAVENOUS | Status: AC | PRN
  Administered 2021-09-13: 10 mL via INTRAVENOUS

## 2021-09-19 ENCOUNTER — Ambulatory Visit
Admission: RE | Admit: 2021-09-19 | Discharge: 2021-09-19 | Disposition: A | Discharge status: Home / Self Care | Payer: Medicare Other | Source: Ambulatory Visit | Attending: Interventional Radiology | Admitting: Interventional Radiology

## 2021-09-19 DIAGNOSIS — N2889 Other specified disorders of kidney and ureter: Secondary | ICD-10-CM

## 2021-09-19 HISTORY — PX: IR RADIOLOGIST EVAL & MGMT: IMG5224
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
## 2021-09-20 ENCOUNTER — Encounter: Payer: Self-pay | Admitting: *Deleted

## 2021-10-05 ENCOUNTER — Other Ambulatory Visit: Payer: Self-pay | Admitting: Cardiology

## 2021-10-05 DIAGNOSIS — R600 Localized edema: Secondary | ICD-10-CM

## 2021-10-21 ENCOUNTER — Telehealth: Payer: Self-pay

## 2021-10-21 NOTE — Telephone Encounter (Signed)
Patient left a voice message 10-21-2021.  [facility] asked pt to call office because insurance is only allowing 1 pill per day of  silodosin (RAPAFLO) 8 MG CAPS capsule  Please advise.  Thanks, Helene Kelp

## 2021-10-25 ENCOUNTER — Other Ambulatory Visit: Payer: Self-pay

## 2021-10-25 NOTE — Telephone Encounter (Signed)
Reviewed chart with Dr. Alyson Ingles  Patient Urolift was performed in March and no longer needs for silodosin.  Patient called and notified he can d/c silodosin. Patient voiced understanding.

## 2021-12-08 ENCOUNTER — Other Ambulatory Visit: Payer: Self-pay | Admitting: Cardiology

## 2021-12-08 DIAGNOSIS — I10 Essential (primary) hypertension: Secondary | ICD-10-CM
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
## 2022-02-24 ENCOUNTER — Ambulatory Visit: Payer: Medicare Other | Attending: Cardiovascular Disease | Admitting: Cardiovascular Disease

## 2022-02-24 VITALS — BP 118/66 | HR 70 | Ht 73.0 in | Wt 281.0 lb

## 2022-02-24 DIAGNOSIS — I25708 Atherosclerosis of coronary artery bypass graft(s), unspecified, with other forms of angina pectoris: Secondary | ICD-10-CM

## 2022-02-24 DIAGNOSIS — I10 Essential (primary) hypertension: Secondary | ICD-10-CM | POA: Diagnosis not present

## 2022-02-24 DIAGNOSIS — E782 Mixed hyperlipidemia: Secondary | ICD-10-CM

## 2022-02-24 MED ORDER — NITROGLYCERIN 0.4 MG SL SUBL: 0.4000 mg | SUBLINGUAL_TABLET | SUBLINGUAL | 3 refills | Status: AC | PRN

## 2022-02-24 NOTE — Addendum Note (Signed)
Addended by: Sharee Holster R on: 02/24/2022 12:21 PM   Modules accepted: Orders

## 2022-02-24 NOTE — Patient Instructions (Signed)
Medication Instructions:  Your physician recommends that you continue on your current medications as directed. Please refer to the Current Medication list given to you today.  *If you need a refill on your cardiac medications before your next appointment, please call your pharmacy*   Lab Work: NONE   If you have labs (blood work) drawn today and your tests are completely normal, you will receive your results only by: [place] (if you have MyChart) OR A paper copy in the mail If you have any lab test that is abnormal or we need to change your treatment, we will call you to review the results.   Testing/Procedures: NONE   Follow-Up: At [facility], you and your health needs are our priority.  As part of our continuing mission to provide you with exceptional heart care, we have created designated Provider Care Teams.  These Care Teams include your primary Cardiologist (physician) and Advanced Practice Providers (APPs -  Physician Assistants and Nurse Practitioners) who all work together to provide you with the care you need, when you need it.  We recommend signing up for the patient portal called "MyChart".  Sign up information is provided on this After Visit Summary.  MyChart is used to connect with patients for Virtual Visits (Telemedicine).  Patients are able to view lab/test results, encounter notes, upcoming appointments, etc.  Non-urgent messages can be sent to your provider as well.   To learn more about what you can do with MyChart, go to [email or web address].    Your next appointment:   1 year(s)  The format for your next appointment:   In Person  Provider:   Jenkins Rouge, MD    Other Instructions Thank you for choosing [facility]!    Important Information About Sugar

## 2022-03-02 ENCOUNTER — Other Ambulatory Visit: Payer: Self-pay | Admitting: Cardiology

## 2022-03-02 ENCOUNTER — Other Ambulatory Visit: Payer: Self-pay | Admitting: Physician Assistant

## 2022-03-02 DIAGNOSIS — R600 Localized edema: Secondary | ICD-10-CM

## 2022-04-19 ENCOUNTER — Other Ambulatory Visit: Payer: Self-pay | Admitting: Physician Assistant

## 2022-05-14 ENCOUNTER — Telehealth: Payer: Self-pay | Admitting: Cardiovascular Disease

## 2022-05-14 NOTE — Telephone Encounter (Signed)
New Message:     Daughter called. She said they need a list of all the medicine  and how he is supposed to be taking it. She said the patient is confused about his medicine.

## 2022-05-15 NOTE — Telephone Encounter (Signed)
Left a message for daughter to call office back regarding medication list for patient.

## 2022-05-16 NOTE — Telephone Encounter (Signed)
Left a message for son/daughter in law to give our office a call back regarding medication list for pt.

## 2022-05-17 ENCOUNTER — Other Ambulatory Visit: Payer: Self-pay | Admitting: Cardiology

## 2022-05-17 DIAGNOSIS — I10 Essential (primary) hypertension: Secondary | ICD-10-CM

## 2022-05-20 ENCOUNTER — Telehealth: Payer: Self-pay | Admitting: Cardiovascular Disease

## 2022-05-20 DIAGNOSIS — R600 Localized edema: Secondary | ICD-10-CM

## 2022-05-20 MED ORDER — TORSEMIDE 20 MG PO TABS: 20.0000 mg | ORAL_TABLET | Freq: Two times a day (BID) | ORAL | 1 refills | Status: DC

## 2022-05-20 NOTE — Telephone Encounter (Signed)
*  STAT* If patient is at the pharmacy, call can be transferred to refill team.   1. Which medications need to be refilled? (please list name of each medication and dose if known) torsemide (DEMADEX) 20 MG tablet   2. Which pharmacy/location (including street and city if local pharmacy) is medication to be sent to?  [id number] [place], [place] - [address]    3. Do they need a 30 day or 90 day supply?  90 day   Pt out of medication

## 2022-05-20 NOTE — Telephone Encounter (Signed)
Refilled as requested  

## 2022-06-17 IMAGING — CT CT BIOPSY
4 of 6 series · 13 of 32 positions shown, 15 images · non-contrast
Comparison: none

INDICATION: Exophytic right lower pole renal mass suspected renal cell
carcinoma.

[Series 2: i-spiral 5.0 br40 · axial · 0.98mm/px · z∈[+1136,+1259]mm · 5 of 59 slices shown, 7 images (1 of 4)]
[im 12/59  mediastinal]
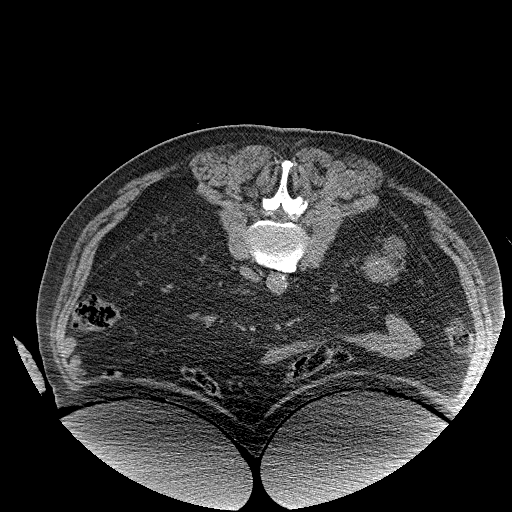
[im 12/59  lung]
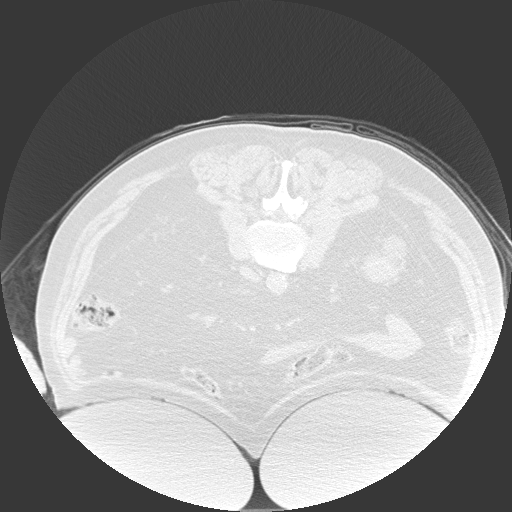
[im 24/59  lung]
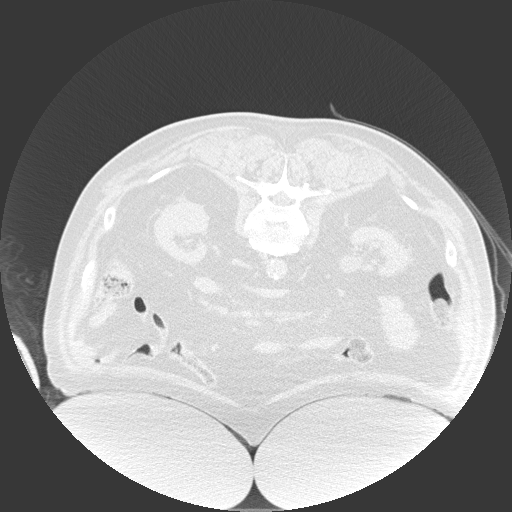
[im 28/59  lung]
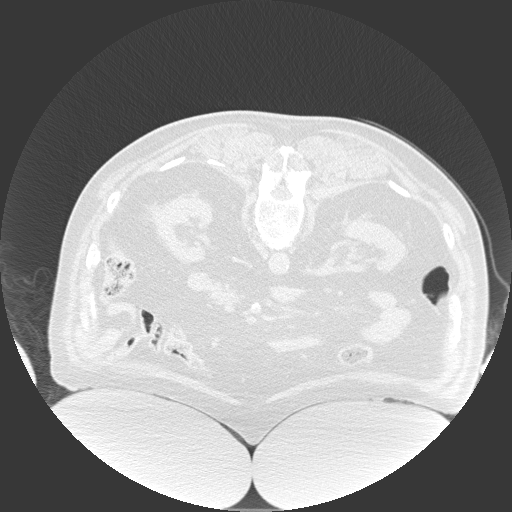
[im 35/59  lung]
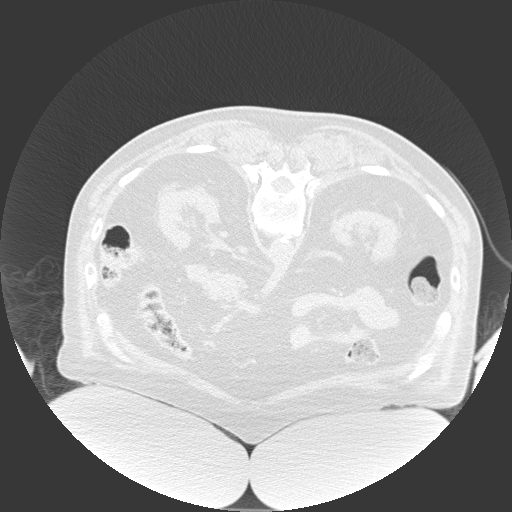
[im 47/59  mediastinal]
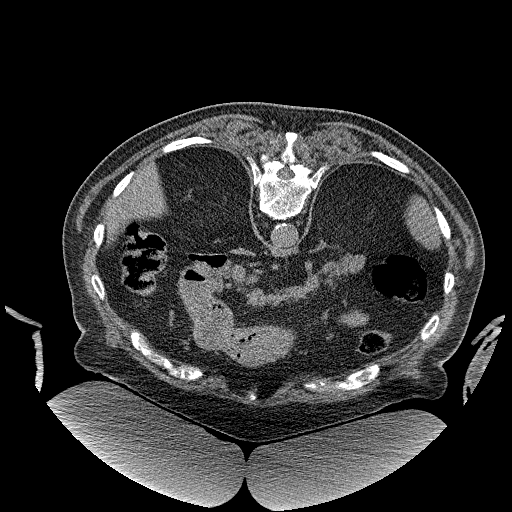
[im 47/59  lung]
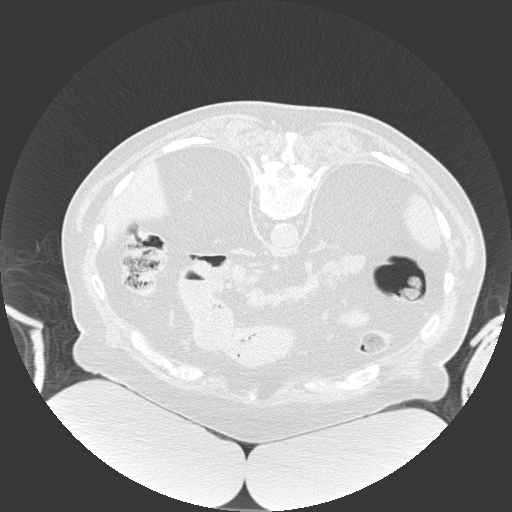

[Series 4: i-spiral 5.0 br40 · axial · 0.98mm/px · z∈[+1160,+1192]mm · 2 of 29 slices shown (2 of 4)]
[im 10/29  lung]
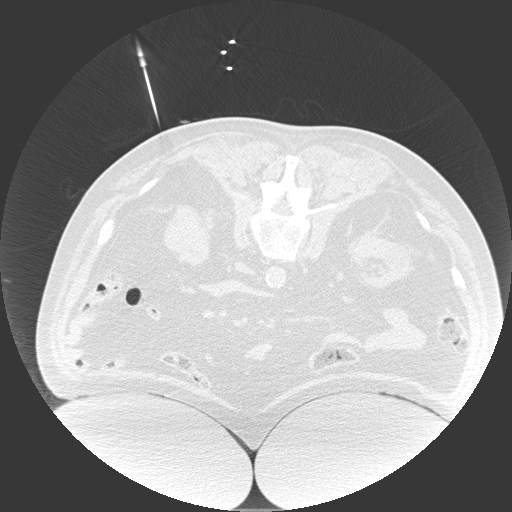
[im 19/29  lung]
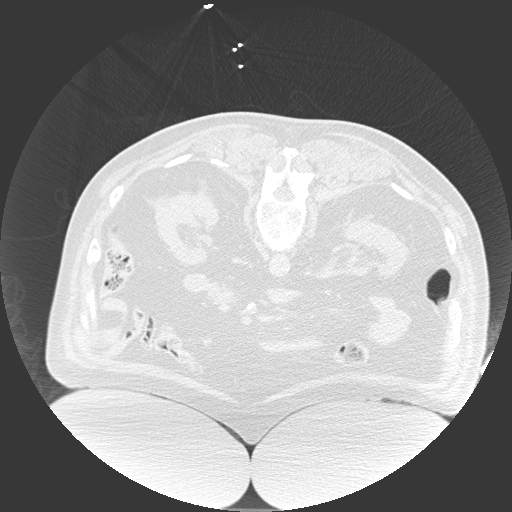

[Series 6: i-spiral 5.0 br40 · axial · 0.98mm/px · z∈[+1164,+1196]mm · 3 of 29 slices shown (3 of 4)]
[im 10/29  lung]
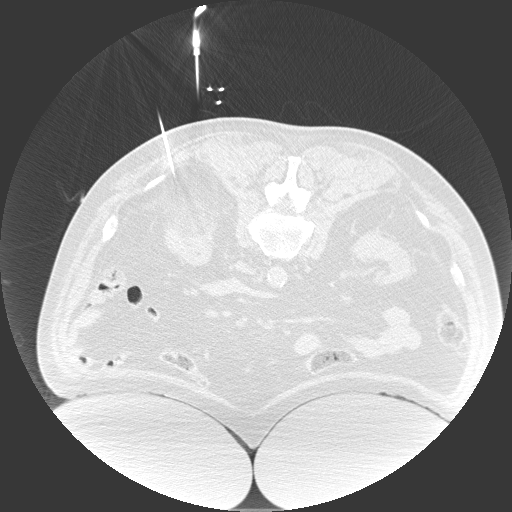
[im 18/29  lung]
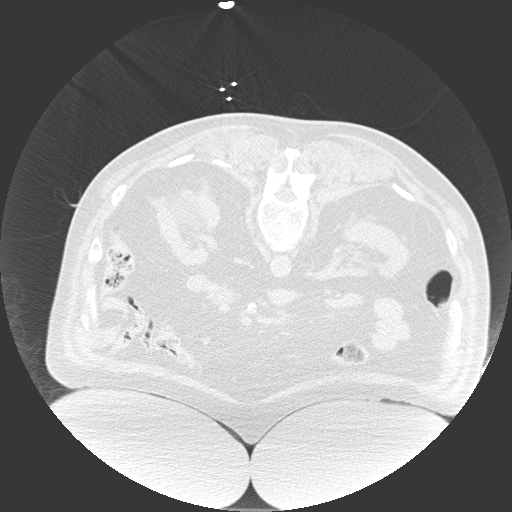
[im 19/29  lung]
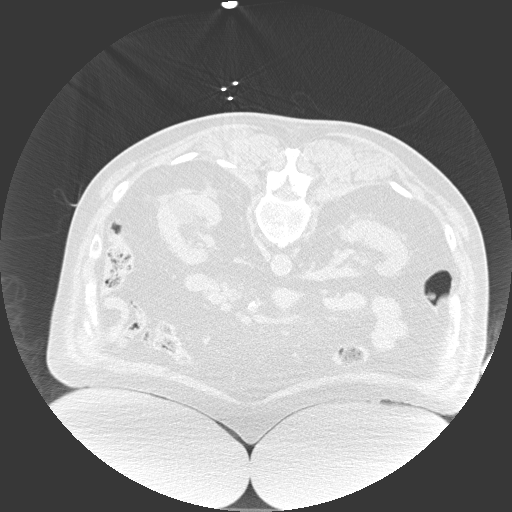

[Series 7: i-spiral 5.0 br40 · axial · 0.98mm/px · z∈[+1164,+1196]mm · 3 of 29 slices shown (4 of 4)]
[im 10/29  lung]
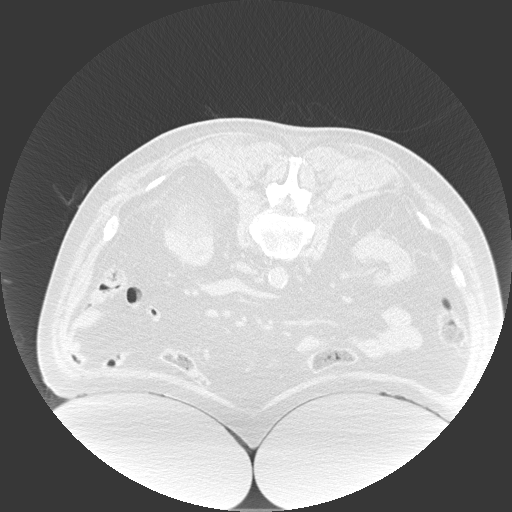
[im 18/29  lung]
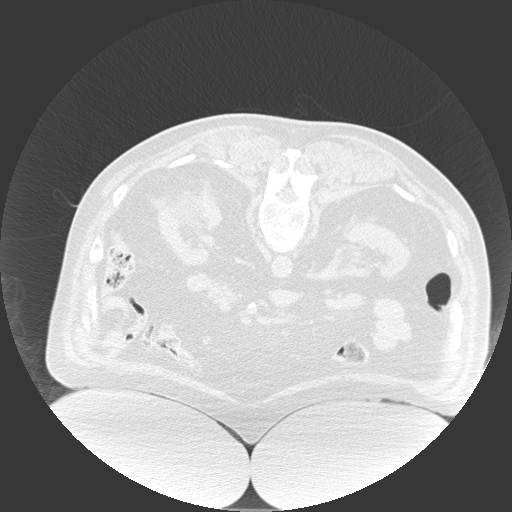
[im 19/29  lung]
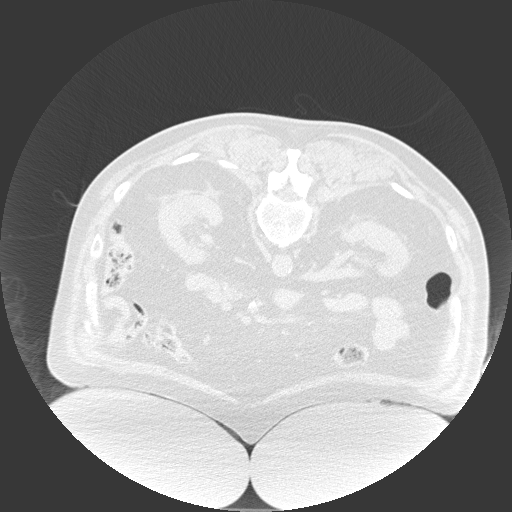

[13 of 32 positions shown; findings below may reference images not displayed]

EXAM:
CT-GUIDED PERCUTANEOUS CRYOABLATION OF RIGHT RENAL MASS

CT-GUIDED CORE BIOPSY, RIGHT RENAL MASS

ANESTHESIA/SEDATION:
General

MEDICATIONS:
1 gram IV ceftriaxone. The antibiotic was administered in an
appropriate time interval prior to needle puncture of the skin.

CONTRAST:  None administered

PROCEDURE:
The procedure, risks, benefits, and alternatives were explained to
the patient. Questions regarding the procedure were encouraged and
answered. The patient understands and consents to the procedure.

The patient was placed under general anesthesia. Initial unenhanced
CT was performed in a prone position to localize the right lower
pole mass, correlating with recent contrast enhanced CT.

The patient was prepped with chlorhexidine in a sterile fashion, and
a sterile drape was applied covering the operative field. A sterile
gown and sterile gloves were used for the procedure.

Under CT guidance, 3 percutaneous cryoablation probes sequentially
advanced into the right lower pole renal mass. Probe positioning was
confirmed by CT prior to cryoablation. Subsequently, a 17 gauge
trocar needle was advanced to the margin of the lesion and coaxial
18 gauge core biopsy samples obtained and submitted in formalin to
surgical pathology.

Cryoablation was performed through the 3 cryoablation probes.
Initial 10 minute cycle of cryoablation was performed. CT confirmed
good ice ball formation and morphology. This was followed by a 8
minute thaw cycle. A second 10 minute cycle of cryoablation was then
performed. After active thaw, the cryoablation probes were removed.

Post-procedural CT was performed.

COMPLICATIONS:
None immediate.
FINDINGS: Right lower pole renal mass was localized. No significant change
from previous contrast CT 01/11/2020. Technically successful
percutaneous cryoablation in core biopsy upper lesion as above.
IMPRESSION: 1. Technically successful CT-guided core biopsy, right lower pole
renal mass.
2. Technically successful CT-guided cryoablation of right lower pole
renal lesion.

PLAN:
Extended overnight observation.

Initial clinical follow-up will be performed in approximately 4
weeks.

Follow-up imaging in 3-4 months and subsequently as dictated by
surgical pathology findings.

## 2022-07-09 ENCOUNTER — Other Ambulatory Visit: Payer: Self-pay | Admitting: Interventional Radiology

## 2022-07-09 DIAGNOSIS — N2889 Other specified disorders of kidney and ureter: Secondary | ICD-10-CM

## 2022-08-07 ENCOUNTER — Ambulatory Visit (HOSPITAL_COMMUNITY)
Admission: RE | Admit: 2022-08-07 | Discharge: 2022-08-07 | Disposition: A | Discharge status: Home / Self Care | Payer: Medicare Other | Source: Ambulatory Visit | Attending: Interventional Radiology | Admitting: Interventional Radiology

## 2022-08-07 DIAGNOSIS — N2889 Other specified disorders of kidney and ureter: Secondary | ICD-10-CM

## 2022-08-07 MED ORDER — GADOBUTROL 1 MMOL/ML IV SOLN
10.0000 mL | Freq: Once | INTRAVENOUS | Status: AC | PRN
  Administered 2022-08-07: 10 mL via INTRAVENOUS

## 2022-08-13 ENCOUNTER — Ambulatory Visit
Admission: RE | Admit: 2022-08-13 | Discharge: 2022-08-13 | Disposition: A | Discharge status: Home / Self Care | Payer: Medicare Other | Source: Ambulatory Visit | Attending: Interventional Radiology | Admitting: Interventional Radiology

## 2022-08-13 DIAGNOSIS — N2889 Other specified disorders of kidney and ureter: Secondary | ICD-10-CM
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
## 2022-09-09 ENCOUNTER — Other Ambulatory Visit: Payer: Self-pay | Admitting: Cardiovascular Disease

## 2022-09-09 DIAGNOSIS — R600 Localized edema: Secondary | ICD-10-CM

## 2022-11-10 ENCOUNTER — Encounter: Payer: Self-pay | Admitting: Cardiovascular Disease
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
## 2023-03-05 ENCOUNTER — Other Ambulatory Visit: Payer: Self-pay | Admitting: Cardiovascular Disease

## 2023-03-05 ENCOUNTER — Other Ambulatory Visit: Payer: Self-pay | Admitting: Cardiology

## 2023-03-05 DIAGNOSIS — I10 Essential (primary) hypertension: Secondary | ICD-10-CM

## 2023-03-05 DIAGNOSIS — R600 Localized edema: Secondary | ICD-10-CM

## 2023-03-05 NOTE — Telephone Encounter (Signed)
This is Dr. Eden Emms seen in [place]. Please address

## 2023-03-09 ENCOUNTER — Ambulatory Visit: Payer: Medicare Other | Attending: Cardiovascular Disease | Admitting: Cardiovascular Disease

## 2023-03-09 ENCOUNTER — Encounter: Payer: Self-pay | Admitting: Cardiovascular Disease

## 2023-03-09 VITALS — BP 116/66 | HR 74 | Ht 73.0 in | Wt 270.0 lb

## 2023-03-09 DIAGNOSIS — I25708 Atherosclerosis of coronary artery bypass graft(s), unspecified, with other forms of angina pectoris: Secondary | ICD-10-CM

## 2023-03-09 DIAGNOSIS — I255 Ischemic cardiomyopathy: Secondary | ICD-10-CM

## 2023-03-09 NOTE — Patient Instructions (Signed)
Medication Instructions:  Your physician recommends that you continue on your current medications as directed. Please refer to the Current Medication list given to you today.   Labwork: None today  Testing/Procedures: Your physician has requested that you have an echocardiogram. Echocardiography is a painless test that uses sound waves to create images of your heart. It provides your doctor with information about the size and shape of your heart and how well your heart's chambers and valves are working. This procedure takes approximately one hour. There are no restrictions for this procedure. Please do NOT wear cologne, perfume, aftershave, or lotions (deodorant is allowed). Please arrive 15 minutes prior to your appointment time.   Follow-Up: 1 year  Any Other Special Instructions Will Be Listed Below (If Applicable).  If you need a refill on your cardiac medications before your next appointment, please call your pharmacy.

## 2023-03-20 ENCOUNTER — Ambulatory Visit (HOSPITAL_COMMUNITY)
Admission: RE | Admit: 2023-03-20 | Discharge: 2023-03-20 | Disposition: A | Discharge status: Home / Self Care | Payer: Medicare Other | Source: Ambulatory Visit | Attending: Cardiovascular Disease | Admitting: Cardiovascular Disease

## 2023-03-20 DIAGNOSIS — I255 Ischemic cardiomyopathy: Secondary | ICD-10-CM | POA: Diagnosis present

## 2023-03-20 LAB — ECHOCARDIOGRAM COMPLETE
AR max vel: 1.88 cm2
AV Area VTI: 1.84 cm2
AV Area mean vel: 2.16 cm2
AV Mean grad: 5 mm[Hg]
AV Peak grad: 11 mm[Hg]
Ao pk vel: 1.66 m/s
Area-P 1/2: 4.54 cm2
Calc EF: 46.3 %
MV VTI: 2.08 cm2
S' Lateral: 3.3 cm
Single Plane A2C EF: 53 %
Single Plane A4C EF: 38.2 %

## 2023-03-24 ENCOUNTER — Telehealth: Payer: Self-pay | Admitting: Cardiovascular Disease

## 2023-03-24 NOTE — Telephone Encounter (Signed)
Wendall Stade, MD 03/23/2023  2:57 PM EST     EF low normal good no bad valves    Results discussed with patient,pcp copied

## 2023-03-24 NOTE — Telephone Encounter (Signed)
Pt returning call for echo results  

## 2023-03-31 LAB — LAB REPORT - SCANNED
Albumin, Urine POC: 77.9
Creatinine, POC: 36.7 mg/dL
EGFR: 48
Microalb Creat Ratio: 212

## 2023-05-22 ENCOUNTER — Encounter: Payer: Self-pay | Admitting: Urology

## 2023-05-22 ENCOUNTER — Ambulatory Visit: Payer: Medicare Other | Admitting: Urology

## 2023-05-22 VITALS — BP 106/73 | HR 76 | Temp 97.4°F

## 2023-05-22 DIAGNOSIS — R350 Frequency of micturition: Secondary | ICD-10-CM | POA: Diagnosis not present

## 2023-05-22 DIAGNOSIS — C641 Malignant neoplasm of right kidney, except renal pelvis: Secondary | ICD-10-CM

## 2023-05-22 DIAGNOSIS — R351 Nocturia: Secondary | ICD-10-CM

## 2023-05-22 DIAGNOSIS — N401 Enlarged prostate with lower urinary tract symptoms: Secondary | ICD-10-CM | POA: Diagnosis not present

## 2023-05-22 DIAGNOSIS — N138 Other obstructive and reflux uropathy: Secondary | ICD-10-CM

## 2023-05-22 DIAGNOSIS — Z87898 Personal history of other specified conditions: Secondary | ICD-10-CM

## 2023-05-22 DIAGNOSIS — R8290 Unspecified abnormal findings in urine: Secondary | ICD-10-CM

## 2023-05-22 LAB — MICROSCOPIC EXAMINATION: WBC, UA: >30 /[HPF] — AB (ref 0–5)

## 2023-05-22 LAB — URINALYSIS, ROUTINE W REFLEX MICROSCOPIC
Bilirubin, UA: NEGATIVE
Ketones, UA: NEGATIVE
Nitrite, UA: NEGATIVE
Protein,UA: NEGATIVE
Specific Gravity, UA: 1.01 (ref 1.005–1.030)
Urobilinogen, Ur: 1 mg/dL (ref 0.2–1.0)
pH, UA: 6 (ref 5.0–7.5)

## 2023-05-22 LAB — BLADDER SCAN AMB NON-IMAGING: Scan Result: 0

## 2023-05-22 MED ORDER — SILODOSIN 8 MG PO CAPS: 8.0000 mg | ORAL_CAPSULE | Freq: Every day | ORAL | 5 refills | Status: DC
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
## 2023-05-22 NOTE — Patient Instructions (Addendum)
 Plan: - Minimize caffeine intake. - Start Rapaflo 8 mg daily. - Complete the antibiotic (Cipro 500 mg twice per day for 7 days) as prescribed by PCP. - CT hematuria protocol. - Return for 1st available cystoscopy with any urology MD.

## 2023-05-24 LAB — URINE CULTURE

## 2023-05-25 LAB — CYTOLOGY, URINE

## 2023-06-01 ENCOUNTER — Telehealth: Payer: Self-pay

## 2023-06-01 NOTE — Telephone Encounter (Signed)
-----   Message from Donnita Falls sent at 05/26/2023 10:15 AM EST ----- Please notify patient: Negative voided cytology - no malignant findings. Follow up for cystoscopy and CT urogram as planned for further evaluation. Thanks.

## 2023-06-01 NOTE — Telephone Encounter (Signed)
 Trying calling patient with no answer, left vm for return call to office.

## 2023-06-02 NOTE — Telephone Encounter (Signed)
 Patient was aware and voiced understanding.

## 2023-06-23 ENCOUNTER — Ambulatory Visit (HOSPITAL_COMMUNITY)
Admission: RE | Admit: 2023-06-23 | Discharge: 2023-06-23 | Disposition: A | Discharge status: Home / Self Care | Payer: Medicare Other | Source: Ambulatory Visit | Attending: Urology | Admitting: Urology

## 2023-06-23 ENCOUNTER — Ambulatory Visit (HOSPITAL_COMMUNITY): Admission: RE | Admit: 2023-06-23 | Payer: Medicare Other | Source: Ambulatory Visit

## 2023-06-23 DIAGNOSIS — R8290 Unspecified abnormal findings in urine: Secondary | ICD-10-CM | POA: Insufficient documentation

## 2023-06-23 DIAGNOSIS — N138 Other obstructive and reflux uropathy: Secondary | ICD-10-CM | POA: Insufficient documentation

## 2023-06-23 DIAGNOSIS — R350 Frequency of micturition: Secondary | ICD-10-CM | POA: Insufficient documentation

## 2023-06-23 DIAGNOSIS — Z87898 Personal history of other specified conditions: Secondary | ICD-10-CM | POA: Insufficient documentation

## 2023-06-23 DIAGNOSIS — C641 Malignant neoplasm of right kidney, except renal pelvis: Secondary | ICD-10-CM | POA: Diagnosis present

## 2023-06-23 DIAGNOSIS — N401 Enlarged prostate with lower urinary tract symptoms: Secondary | ICD-10-CM | POA: Insufficient documentation

## 2023-06-23 DIAGNOSIS — R351 Nocturia: Secondary | ICD-10-CM | POA: Insufficient documentation

## 2023-06-23 MED ORDER — IOHEXOL 300 MG/ML  SOLN
125.0000 mL | Freq: Once | INTRAMUSCULAR | Status: AC | PRN
  Administered 2023-06-23: 100 mL via INTRAVENOUS

## 2023-06-24 LAB — POCT I-STAT CREATININE: Creatinine, Ser: 1.6 mg/dL — ABNORMAL HIGH (ref 0.61–1.24)

## 2023-06-29 ENCOUNTER — Ambulatory Visit: Payer: Medicare Other | Admitting: Urology

## 2023-06-29 VITALS — BP 120/71 | HR 71

## 2023-06-29 DIAGNOSIS — R310 Gross hematuria: Secondary | ICD-10-CM

## 2023-06-29 DIAGNOSIS — Z87898 Personal history of other specified conditions: Secondary | ICD-10-CM

## 2023-06-29 DIAGNOSIS — N400 Enlarged prostate without lower urinary tract symptoms: Secondary | ICD-10-CM | POA: Diagnosis not present

## 2023-06-29 MED ORDER — CIPROFLOXACIN HCL 500 MG PO TABS
500.0000 mg | ORAL_TABLET | Freq: Once | ORAL | Status: AC
  Administered 2023-06-29: 500 mg via ORAL

## 2023-06-29 MED ORDER — DOXYCYCLINE HYCLATE 100 MG PO CAPS: 100.0000 mg | ORAL_CAPSULE | Freq: Two times a day (BID) | ORAL | 0 refills | Status: DC

## 2023-06-29 NOTE — Progress Notes (Signed)
   06/29/23  CC: hematuria   HPI: [name] is a 83yo here for cystoscopy for hematuria Blood pressure 120/71, pulse 71. NED. A&Ox3.   No respiratory distress   Abd soft, NT, ND Normal phallus with bilateral descended testicles  Cystoscopy Procedure Note  Patient identification was confirmed, informed consent was obtained, and patient was prepped using Betadine solution.  Lidocaine jelly was administered per urethral meatus.     Pre-Procedure: - Inspection reveals a normal caliber ureteral meatus.  Procedure: The flexible cystoscope was introduced without difficulty - No urethral strictures/lesions are present. - Enlarged prostate erythematous prostatic urethra - Normal bladder neck - Bilateral ureteral orifices identified - Bladder mucosa  reveals no ulcers, tumors, or lesions - No bladder stones - No trabeculation   Post-Procedure: - Patient tolerated the procedure well  Assessment/ Plan: Doxycycline 100mg  BID for 28 days  No follow-ups on file.  Wilkie Aye, MD

## 2023-07-07 ENCOUNTER — Encounter: Payer: Self-pay | Admitting: Urology

## 2023-07-07 NOTE — Patient Instructions (Signed)
 Prostatitis  Prostatitis is swelling of the prostate gland, also called the prostate. This gland is about 1.5 inches wide and 1 inch high, and it helps to make a fluid called semen. The prostate is below a man's bladder, in front of the butt (rectum). There are different types of prostatitis. What are the causes? One type of prostatitis is caused by an infection from germs (bacteria). Another type is not caused by germs. It may be caused by: Things having to do with the nervous system. This system includes thebrain, spinal cord, and nerves. An autoimmune response. This happens when the body's disease-fighting system attacks healthy tissue in the body by mistake. Psychological factors. These have to do with how the mind works. The causes of other types of prostatitis are normally not known. What are the signs or symptoms? Symptoms of this condition depend on the type of prostatitis you have. If your condition is caused by germs: You may feel pain or burning when you pee (urinate). You may pee often and all of a sudden. You may have problems starting to pee. You may have trouble emptying your bladder when you pee. You may have fever or chills. You may feel pain in your muscles, joints, low back, or lower belly. If you have other types of prostatitis: You may pee often or all of a sudden. You may have trouble starting to pee. You may have a weak flow when you pee. You may leak pee after using the bathroom. You may have other problems, such as: Abnormal fluid coming from the penis. Pain in the testicles or penis. Pain between the butt and the testicles. Pain when fluid comes out of the penis during sex. How is this treated? Treatment for this condition depends on the type of prostatitis. Treatment may include: Medicines. These may treat pain or swelling, or they may help relax muscles. Exercises to help you move better or get stronger (physical therapy). Heat therapy. Techniques to help  you control some of the ways that your body works. Exercises to help you relax. Antibiotic medicine, if your condition is caused by germs. Warm water baths (sitz baths) to relax muscles. Follow these instructions at home: Medicines Take over-the-counter and prescription medicines only as told by your doctor. If you were prescribed an antibiotic medicine, take it as told by your doctor. Do not stop using the antibiotic even if you start to feel better. Managing pain and swelling  Take sitz baths as told by your doctor. For a sitz bath, sit in warm water that is deep enough to cover your hips and butt. If told, put heat on the painful area. Do this as often as told by your doctor. Use the heat source that your doctor recommends, such as a moist heat pack or a heating pad. Place a towel between your skin and the heat source. Leave the heat on for 20-30 minutes. Take off the heat if your skin turns bright red. This is very important if you are unable to feel pain, heat, or cold. You may have a greater risk of getting burned. General instructions Do exercises as told by your doctor, if your doctor prescribed them. Keep all follow-up visits as told by your doctor. This is important. Where to find more information [employer] of Diabetes and Digestive and Kidney Diseases: LowApproval.se Contact a doctor if: Your symptoms get worse. You have a fever. Get help right away if: You have chills. You feel light-headed. You feel like you may  faint. You cannot pee. You have blood or clumps of blood (blood clots) in your pee. Summary Prostatitis is swelling of the prostate gland. There are different types of prostatitis. Treatment depends on the type that you have. Take over-the-counter and prescription medicines only as told by your doctor. Get help right away of you have chills, feel light-headed, or feel like you may faint. Also get help right away if you cannot pee or you have  blood or clumps of blood in your pee. This information is not intended to replace advice given to you by your health care provider. Make sure you discuss any questions you have with your health care provider. Document Revised: 03/20/2022 Document Reviewed: 03/20/2022 Elsevier Patient Education  2024 [employer].

## 2023-07-14 ENCOUNTER — Telehealth: Payer: Self-pay

## 2023-07-14 NOTE — Telephone Encounter (Signed)
 Tried calling patient with no answer. Left detailed vm per patient on his home answer machine.

## 2023-07-14 NOTE — Telephone Encounter (Signed)
-----   Message from Donnita Falls sent at 07/14/2023  1:26 PM EST ----- Please let pt know his CT showed no acute findings to explain hematuria. No evidence of recurrent or metastatic disease. Advised to follow up with Dr. Ronne Binning on 08/26/2023 as scheduled.

## 2023-07-17 ENCOUNTER — Other Ambulatory Visit: Payer: Self-pay | Admitting: Urology

## 2023-08-13 ENCOUNTER — Other Ambulatory Visit: Payer: Self-pay | Admitting: Urology

## 2023-08-26 ENCOUNTER — Encounter: Payer: Self-pay | Admitting: Urology

## 2023-08-26 ENCOUNTER — Ambulatory Visit: Payer: Medicare Other | Admitting: Urology

## 2023-08-26 VITALS — BP 125/77 | HR 69

## 2023-08-26 DIAGNOSIS — N419 Inflammatory disease of prostate, unspecified: Secondary | ICD-10-CM

## 2023-08-26 DIAGNOSIS — N401 Enlarged prostate with lower urinary tract symptoms: Secondary | ICD-10-CM | POA: Diagnosis not present

## 2023-08-26 DIAGNOSIS — N138 Other obstructive and reflux uropathy: Secondary | ICD-10-CM

## 2023-08-26 DIAGNOSIS — R350 Frequency of micturition: Secondary | ICD-10-CM | POA: Diagnosis not present

## 2023-08-26 DIAGNOSIS — R351 Nocturia: Secondary | ICD-10-CM

## 2023-08-26 DIAGNOSIS — N411 Chronic prostatitis: Secondary | ICD-10-CM

## 2023-08-26 LAB — URINALYSIS, ROUTINE W REFLEX MICROSCOPIC
Bilirubin, UA: NEGATIVE
Ketones, UA: NEGATIVE
Leukocytes,UA: NEGATIVE
Nitrite, UA: NEGATIVE
Protein,UA: NEGATIVE
RBC, UA: NEGATIVE
Specific Gravity, UA: 1.01 (ref 1.005–1.030)
Urobilinogen, Ur: 0.2 mg/dL (ref 0.2–1.0)
pH, UA: 6 (ref 5.0–7.5)

## 2023-08-26 MED ORDER — SILODOSIN 8 MG PO CAPS: 8.0000 mg | ORAL_CAPSULE | Freq: Every day | ORAL | 11 refills | Status: DC
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
## 2023-08-26 NOTE — Patient Instructions (Signed)

## 2023-09-08 ENCOUNTER — Other Ambulatory Visit: Payer: Self-pay | Admitting: Interventional Radiology

## 2023-09-08 DIAGNOSIS — N2889 Other specified disorders of kidney and ureter: Secondary | ICD-10-CM

## 2023-09-09 ENCOUNTER — Ambulatory Visit: Admitting: Orthopedic Surgery

## 2023-10-14 ENCOUNTER — Other Ambulatory Visit: Payer: Self-pay

## 2023-10-14 DIAGNOSIS — R600 Localized edema: Secondary | ICD-10-CM

## 2023-10-14 MED ORDER — TORSEMIDE 20 MG PO TABS: 20.0000 mg | ORAL_TABLET | Freq: Two times a day (BID) | ORAL | 1 refills | Status: DC

## 2024-02-26 ENCOUNTER — Ambulatory Visit: Admitting: Urology

## 2024-02-26 ENCOUNTER — Encounter: Payer: Self-pay | Admitting: Urology

## 2024-02-26 VITALS — BP 113/58 | HR 72

## 2024-02-26 DIAGNOSIS — N138 Other obstructive and reflux uropathy: Secondary | ICD-10-CM

## 2024-02-26 DIAGNOSIS — R350 Frequency of micturition: Secondary | ICD-10-CM

## 2024-02-26 DIAGNOSIS — N401 Enlarged prostate with lower urinary tract symptoms: Secondary | ICD-10-CM | POA: Diagnosis not present

## 2024-02-26 DIAGNOSIS — N419 Inflammatory disease of prostate, unspecified: Secondary | ICD-10-CM

## 2024-02-26 DIAGNOSIS — R351 Nocturia: Secondary | ICD-10-CM | POA: Diagnosis not present

## 2024-02-26 LAB — URINALYSIS, ROUTINE W REFLEX MICROSCOPIC
Bilirubin, UA: NEGATIVE
Ketones, UA: NEGATIVE
Leukocytes,UA: NEGATIVE
Nitrite, UA: NEGATIVE
Protein,UA: NEGATIVE
RBC, UA: NEGATIVE
Specific Gravity, UA: 1.01 (ref 1.005–1.030)
Urobilinogen, Ur: 0.2 mg/dL (ref 0.2–1.0)
pH, UA: 7 (ref 5.0–7.5)

## 2024-02-26 MED ORDER — GEMTESA 75 MG PO TABS: 1.0000 | ORAL_TABLET | Freq: Every day | ORAL | Status: AC

## 2024-02-26 MED ORDER — SILODOSIN 8 MG PO CAPS
8.0000 mg | ORAL_CAPSULE | Freq: Every day | ORAL | 11 refills | Status: AC
Start: 2024-02-26 — End: ?

## 2024-02-26 NOTE — Patient Instructions (Signed)
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
## 2024-03-04 ENCOUNTER — Other Ambulatory Visit: Payer: Self-pay | Admitting: Cardiovascular Disease

## 2024-03-04 DIAGNOSIS — R600 Localized edema: Secondary | ICD-10-CM

## 2024-04-13 ENCOUNTER — Other Ambulatory Visit: Payer: Self-pay

## 2024-04-13 ENCOUNTER — Encounter (HOSPITAL_COMMUNITY): Payer: Self-pay | Admitting: Emergency Medicine

## 2024-04-13 ENCOUNTER — Emergency Department (HOSPITAL_COMMUNITY)

## 2024-04-13 ENCOUNTER — Telehealth: Payer: Self-pay | Admitting: Podiatry

## 2024-04-13 ENCOUNTER — Emergency Department (HOSPITAL_COMMUNITY)
Admission: EM | Admit: 2024-04-13 | Discharge: 2024-04-13 | Disposition: A | Discharge status: Home / Self Care | Attending: Emergency Medicine | Admitting: Emergency Medicine

## 2024-04-13 DIAGNOSIS — Z951 Presence of aortocoronary bypass graft: Secondary | ICD-10-CM | POA: Insufficient documentation

## 2024-04-13 DIAGNOSIS — E11621 Type 2 diabetes mellitus with foot ulcer: Secondary | ICD-10-CM | POA: Insufficient documentation

## 2024-04-13 DIAGNOSIS — Z794 Long term (current) use of insulin: Secondary | ICD-10-CM | POA: Insufficient documentation

## 2024-04-13 DIAGNOSIS — L97519 Non-pressure chronic ulcer of other part of right foot with unspecified severity: Secondary | ICD-10-CM | POA: Diagnosis not present

## 2024-04-13 DIAGNOSIS — I10 Essential (primary) hypertension: Secondary | ICD-10-CM | POA: Insufficient documentation

## 2024-04-13 DIAGNOSIS — Z7902 Long term (current) use of antithrombotics/antiplatelets: Secondary | ICD-10-CM | POA: Diagnosis not present

## 2024-04-13 DIAGNOSIS — Z79899 Other long term (current) drug therapy: Secondary | ICD-10-CM | POA: Insufficient documentation

## 2024-04-13 DIAGNOSIS — I2510 Atherosclerotic heart disease of native coronary artery without angina pectoris: Secondary | ICD-10-CM | POA: Diagnosis not present

## 2024-04-13 DIAGNOSIS — L089 Local infection of the skin and subcutaneous tissue, unspecified: Secondary | ICD-10-CM

## 2024-04-13 DIAGNOSIS — L608 Other nail disorders: Secondary | ICD-10-CM

## 2024-04-13 DIAGNOSIS — Z7984 Long term (current) use of oral hypoglycemic drugs: Secondary | ICD-10-CM | POA: Diagnosis not present

## 2024-04-13 LAB — SEDIMENTATION RATE: Sed Rate: 10 mm/h (ref 0–20)

## 2024-04-13 LAB — COMPREHENSIVE METABOLIC PANEL WITH GFR
ALT: 11 U/L (ref 0–44)
AST: 18 U/L (ref 15–41)
Albumin: 4.1 g/dL (ref 3.5–5.0)
Alkaline Phosphatase: 79 U/L (ref 38–126)
Anion gap: 11 (ref 5–15)
BUN: 17 mg/dL (ref 8–23)
CO2: 27 mmol/L (ref 22–32)
Calcium: 9.5 mg/dL (ref 8.9–10.3)
Chloride: 101 mmol/L (ref 98–111)
Creatinine, Ser: 1.38 mg/dL — ABNORMAL HIGH (ref 0.61–1.24)
GFR, Estimated: 50 mL/min — ABNORMAL LOW (ref >60)
Glucose, Bld: 132 mg/dL — ABNORMAL HIGH (ref 70–99)
Potassium: 3.8 mmol/L (ref 3.5–5.1)
Sodium: 139 mmol/L (ref 135–145)
Total Bilirubin: 1.2 mg/dL (ref 0.0–1.2)
Total Protein: 6.5 g/dL (ref 6.5–8.1)

## 2024-04-13 LAB — I-STAT CHEM 8, ED
BUN: 18 mg/dL (ref 8–23)
Calcium, Ion: 1.12 mmol/L — ABNORMAL LOW (ref 1.15–1.40)
Chloride: 100 mmol/L (ref 98–111)
Creatinine, Ser: 1.5 mg/dL — ABNORMAL HIGH (ref 0.61–1.24)
Glucose, Bld: 131 mg/dL — ABNORMAL HIGH (ref 70–99)
HCT: 40 % (ref 39.0–52.0)
Hemoglobin: 13.6 g/dL (ref 13.0–17.0)
Potassium: 3.8 mmol/L (ref 3.5–5.1)
Sodium: 140 mmol/L (ref 135–145)
TCO2: 25 mmol/L (ref 22–32)

## 2024-04-13 LAB — CBC WITH DIFFERENTIAL/PLATELET
Abs Immature Granulocytes: 0.02 K/uL (ref 0.00–0.07)
Basophils Absolute: 0.1 K/uL (ref 0.0–0.1)
Basophils Relative: 2 %
Eosinophils Absolute: 0.4 K/uL (ref 0.0–0.5)
Eosinophils Relative: 5 %
HCT: 41.5 % (ref 39.0–52.0)
Hemoglobin: 14 g/dL (ref 13.0–17.0)
Immature Granulocytes: 0 %
Lymphocytes Relative: 22 %
Lymphs Abs: 1.6 K/uL (ref 0.7–4.0)
MCH: 31.6 pg (ref 26.0–34.0)
MCHC: 33.7 g/dL (ref 30.0–36.0)
MCV: 93.7 fL (ref 80.0–100.0)
Monocytes Absolute: 1 K/uL (ref 0.1–1.0)
Monocytes Relative: 13 %
Neutro Abs: 4.2 K/uL (ref 1.7–7.7)
Neutrophils Relative %: 58 %
Platelets: 248 K/uL (ref 150–400)
RBC: 4.43 MIL/uL (ref 4.22–5.81)
RDW: 14 % (ref 11.5–15.5)
WBC: 7.2 K/uL (ref 4.0–10.5)
nRBC: 0 % (ref 0.0–0.2)

## 2024-04-13 LAB — C-REACTIVE PROTEIN: CRP: 0.6 mg/dL (ref <1.0)

## 2024-04-13 MED ORDER — DOXYCYCLINE HYCLATE 100 MG PO CAPS: 100.0000 mg | ORAL_CAPSULE | Freq: Two times a day (BID) | ORAL | 0 refills | Status: AC

## 2024-04-13 MED ORDER — AMOXICILLIN-POT CLAVULANATE 875-125 MG PO TABS: 1.0000 | ORAL_TABLET | Freq: Two times a day (BID) | ORAL | 0 refills | Status: AC

## 2024-04-13 NOTE — ED Triage Notes (Signed)
 Pt complains of right big toe black and red. Denies any injury. Noticed it for the first time yesterday. Pt states great toe feels numb today. Pt has swelling in bilateral feet and lower legs but states that is his normal. Denies any pain. Called PCP today regarding toe and was told to come to ER.

## 2024-04-13 NOTE — Discharge Instructions (Signed)
 You were seen for your foot infection in the emergency department.   At home, please take the Augmentin  and doxycycline  for your wound.   Check your MyChart online for the results of any tests that had not resulted by the time you left the emergency department.   Follow-up with your primary doctor in 2-3 days regarding your visit.  Follow-up with podiatry next week.   Return immediately to the emergency department if you experience any of the following: fevers, worsening pain, or any other concerning symptoms.    Thank you for visiting our Emergency Department. It was a pleasure taking care of you today.

## 2024-04-13 NOTE — Telephone Encounter (Signed)
 Patient call transferred via PACT. Patient states that 3 days ago, he had been raking leaves in the yard, he was wearing shoes, but when he came in and took them off, he notices what he thought was some debris between his big toe and the one beside it on his left foot. He states that he stuck his index finger between the toes to get it out and when he did, a bunch of skin came off. He said it looks much worse now. He said it is red on the bottom, but it is starting to turn black on the top and around his nail. He said his toe nail looks really bad. He states that he doesn't have much feeling in his foot and it has been that way for about 3 years. Spoke with Natalie, [place] on call and her recommendation is for him to go to the ER. Gave patient recommendation and he is agreeable with this plan.

## 2024-04-13 NOTE — ED Provider Notes (Signed)
 [place] EMERGENCY DEPARTMENT AT [facility] Provider Note   CSN: [id number] Arrival date & time: 04/13/24  1045     Patient presents with: Toe Pain   Blake Hurst is a 83 y.o. male.   83 year old male history of CAD status post CABG, hypertension, diabetes who presents emergency department with a toe wound.  Patient reports that he is insensate on his feet from neuropathy.  Says that yesterday he noticed a wound on his right great toe.  Says that the skin fell off of it and is black underneath.  Does not have any pain associated with it.  No fevers or chills.       Prior to Admission medications   Medication Sig Start Date End Date Taking? Authorizing Provider  amoxicillin -clavulanate (AUGMENTIN ) 875-125 MG tablet Take 1 tablet by mouth every 12 (twelve) hours. 04/13/24  Yes Yolande [name] BROCKS, MD  atorvastatin  (LIPITOR ) 80 MG tablet Take 80 mg by mouth daily at 6 PM. One tablet daily 01/19/13   [provider]  BD PEN NEEDLE NANO 2ND GEN 32G X 4 MM MISC daily. 11/22/19   [provider]  clopidogrel  (PLAVIX ) 75 MG tablet Take 75 mg by mouth daily.  01/19/13   [provider]  dapagliflozin propanediol (FARXIGA) 10 MG TABS tablet Take 10 mg by mouth daily.    [provider]  doxycycline  (VIBRAMYCIN ) 100 MG capsule Take 1 capsule (100 mg total) by mouth every 12 (twelve) hours for 7 days. 04/13/24 04/20/24  Yolande [name] BROCKS, MD  glucose blood (ONETOUCH VERIO) test strip USE TO TEST BLOOD SUGAR 2-3 TIMES DAILY 09/05/19   [provider]  insulin  detemir (LEVEMIR) 100 UNIT/ML injection Inject 60 Units into the skin in the morning.    [provider]  isosorbide  mononitrate (IMDUR ) 30 MG 24 hr tablet TAKE 1 TABLET BY MOUTH EVERY DAY Patient taking differently: Take 30 mg by mouth daily. 07/08/16   Mona Vinie BROCKS, MD  metFORMIN (GLUCOPHAGE) 1000 MG tablet Take 1,000 mg by mouth 2 (two) times daily with a meal. 11/06/20    [provider]  metoprolol (LOPRESSOR) 50 MG tablet Take 50 mg by mouth 2 (two) times daily.  01/19/13   [provider]  miconazole  (MICOTIN) 2 % cream APPLY TOPICALLY 2 TIMES DAILY AS DIRECTED. 03/03/22   Summerlin, Julienne Annette, PA-C  nitroGLYCERIN  (NITROSTAT ) 0.4 MG SL tablet Place 1 tablet (0.4 mg total) under the tongue every 5 (five) minutes as needed for chest pain. 02/24/22 03/09/23  Delford Maude BROCKS, MD  NOVOTWIST 30G X 8 MM MISC One needle daily 01/20/13   [provider]  omeprazole (PRILOSEC) 20 MG capsule Take 20 mg by mouth daily. 11/28/19   [provider]  OneTouch Delica Lancets 33G MISC Apply topically. 05/28/20   [provider]  pregabalin (LYRICA) 75 MG capsule Take 75 mg by mouth every evening.     [provider]  silodosin  (RAPAFLO ) 8 MG CAPS capsule Take 1 capsule (8 mg total) by mouth daily with breakfast. 02/26/24   McKenzie, Belvie CROME, MD  torsemide  (DEMADEX ) 20 MG tablet TAKE 1 TABLET BY MOUTH TWICE A DAY 03/04/24   Delford Maude BROCKS, MD  Vibegron  (GEMTESA ) 75 MG TABS Take 1 tablet (75 mg total) by mouth daily. 02/26/24   McKenzie, Belvie CROME, MD  VICTOZA 18 MG/3ML SOPN Inject 1.8 mg into the skin in the morning.  Patient not taking: Reported on 06/29/2023 12/29/12   [provider]    Allergies: Niacin and related    Review of Systems  Updated Vital Signs BP 110/80 (BP Location: Right Arm)   Pulse 69   Temp 98.1 F (36.7 C) (Oral)   Resp 20   Ht 6' 1 (1.854 m)   Wt 124.7 kg   SpO2 96%   BMI 36.28 kg/m   Physical Exam Constitutional:      Appearance: Normal appearance.  Musculoskeletal:     Comments: Right foot appears warm and well-perfused.  Cap refill less than 2 seconds in all toes aside from the right great toe. DP pulses biphasic and dopplerable bilaterally.  Neurological:     Mental Status: He is alert.     Right great toe:      (all labs ordered are listed, but only abnormal  results are displayed) Labs Reviewed  COMPREHENSIVE METABOLIC PANEL WITH GFR - Abnormal; Notable for the following components:      Result Value   Glucose, Bld 132 (*)    Creatinine, Ser 1.38 (*)    GFR, Estimated 50 (*)    All other components within normal limits  I-STAT CHEM 8, ED - Abnormal; Notable for the following components:   Creatinine, Ser 1.50 (*)    Glucose, Bld 131 (*)    Calcium , Ion 1.12 (*)    All other components within normal limits  CBC WITH DIFFERENTIAL/PLATELET  SEDIMENTATION RATE  C-REACTIVE PROTEIN    EKG: None  Radiology: US  ARTERIAL ABI (SCREENING LOWER EXTREMITY) Result Date: 04/13/2024 CLINICAL DATA:  Right great toe discoloration. History of diabetes retention. EXAM: NONINVASIVE PHYSIOLOGIC VASCULAR STUDY OF BILATERAL LOWER EXTREMITIES TECHNIQUE: Evaluation of both lower extremities were performed at rest, including calculation of ankle-brachial indices with single level Doppler, pressure and pulse volume recording. COMPARISON:  None Available. FINDINGS: Right ABI:  1.08 Left ABI:  1.14 Right Lower Extremity:  Multiphasic waveforms at the ankle. Left Lower Extremity:  Multiphasic waveforms at the ankle. IMPRESSION: Normal ABIs bilaterally. Electronically Signed   By: Cordella Banner   On: 04/13/2024 15:26   DG Toe Great Right Result Date: 04/13/2024 EXAM: 3 VIEW(S) Xray of the right toes 04/13/2024 11:17:00 AM COMPARISON: None available. CLINICAL HISTORY: Black toe Black toe FINDINGS: BONES AND JOINTS: No acute fracture. No focal osseous lesion. No joint dislocation. SOFT TISSUES: The soft tissues are unremarkable. IMPRESSION: 1. No significant abnormality. Electronically signed by: Evalene Coho MD 04/13/2024 12:02 PM EST RP Workstation: [id number]     Procedures   Medications Ordered in the ED - No data to display  Clinical Course as of 04/13/24 1659  Wed Apr 13, 2024  1133 Discussed with Dr Pierce from podiatry. They will see patient early  next week.  [RP]    Clinical Course User Index [RP] Yolande [name] BROCKS, MD                                 Medical Decision Making Amount and/or Complexity of Data Reviewed Labs: ordered. Radiology: ordered.  Risk Prescription drug management.   83 year old male with a history of CAD status post CABG, hypertension, diabetes, and diabetic neuropathy who presents to the emergency department with a foot wound  Initial Ddx:  Diabetic foot wound, osteomyelitis, necrotizing fasciitis, sepsis, arterial insufficiency, acute limb ischemia  MDM/Course:  Patient presents to the emergency department with a wound on his great toe.  Does appear to have a black eschar over it.  Does have dopplerable pulses in his foot.  The rest of the foot appears warm and well-perfused.  Does not have significant cellulitis streaking up his leg.  No signs of necrotizing fasciitis.  He is afebrile and otherwise well-appearing.  No white blood cell count.  Had an ESR and CRP that were sent.  CRP is still pending but sed rate is normal.  X-ray of the toe without signs of osteomyelitis.  ABIs were normal in his leg.  Discussed with podiatry who would like to see him as an outpatient and recommend starting him on antibiotics.  Upon re-evaluation no new symptoms and remained hemodynamically stable.  Discussed the results with him and the importance of following up with podiatry and taking his antibiotics  This patient presents to the ED for concern of complaints listed in HPI, this involves an extensive number of treatment options, and is a complaint that carries with it a high risk of complications and morbidity. Disposition including potential need for admission considered.   Dispo: DC Home. Return precautions discussed including, but not limited to, those listed in the AVS. Allowed pt time to ask questions which were answered fully prior to dc.  Additional history obtained from family Records reviewed Outpatient Clinic  Notes The following labs were independently interpreted: Chemistry and show CKD I independently reviewed the following imaging with scope of interpretation limited to determining acute life threatening conditions related to emergency care: Extremity x-ray(s) and agree with the radiologist interpretation with the following exceptions: none I personally reviewed and interpreted cardiac monitoring: normal sinus rhythm  I personally reviewed and interpreted the pt's EKG: see above for interpretation  I have reviewed the patients home medications and made adjustments as needed Consults: Podiatry Social Determinants of health:  Geriatric  Portions of this note were generated with Scientist, clinical (histocompatibility and immunogenetics). Dictation errors may occur despite best attempts at proofreading.     Final diagnoses:  Diabetic foot infection [facility])    ED Discharge Orders          Ordered    amoxicillin -clavulanate (AUGMENTIN ) 875-125 MG tablet  Every 12 hours        04/13/24 1457    doxycycline  (VIBRAMYCIN ) 100 MG capsule  Every 12 hours        04/13/24 1457               Yolande [name] BROCKS, MD 04/13/24 1659

## 2024-04-13 NOTE — Telephone Encounter (Signed)
 LVM for patient to call and schedule a follow-up appointment for the first of next week (04/18/2024). Patient was seen in the [facility] for a toe ulcer and was advised by the ED provider to follow up.

## 2024-05-04 ENCOUNTER — Other Ambulatory Visit: Payer: Self-pay | Admitting: Cardiovascular Disease

## 2024-05-04 DIAGNOSIS — R600 Localized edema: Secondary | ICD-10-CM

## 2024-05-30 ENCOUNTER — Other Ambulatory Visit: Payer: Self-pay | Admitting: Urology

## 2024-05-30 ENCOUNTER — Encounter (HOSPITAL_BASED_OUTPATIENT_CLINIC_OR_DEPARTMENT_OTHER): Attending: Internal Medicine | Admitting: Internal Medicine

## 2024-05-30 DIAGNOSIS — S91301A Unspecified open wound, right foot, initial encounter: Secondary | ICD-10-CM | POA: Insufficient documentation

## 2024-05-30 DIAGNOSIS — L97512 Non-pressure chronic ulcer of other part of right foot with fat layer exposed: Secondary | ICD-10-CM | POA: Diagnosis not present

## 2024-05-30 DIAGNOSIS — E11621 Type 2 diabetes mellitus with foot ulcer: Secondary | ICD-10-CM | POA: Insufficient documentation

## 2024-05-30 DIAGNOSIS — I87312 Chronic venous hypertension (idiopathic) with ulcer of left lower extremity: Secondary | ICD-10-CM | POA: Insufficient documentation

## 2024-06-13 ENCOUNTER — Encounter (HOSPITAL_BASED_OUTPATIENT_CLINIC_OR_DEPARTMENT_OTHER): Admitting: Internal Medicine

## 2024-06-15 ENCOUNTER — Encounter (HOSPITAL_BASED_OUTPATIENT_CLINIC_OR_DEPARTMENT_OTHER): Admitting: Internal Medicine

## 2024-06-16 ENCOUNTER — Telehealth: Payer: Self-pay | Admitting: Cardiovascular Disease

## 2024-06-16 ENCOUNTER — Encounter (HOSPITAL_BASED_OUTPATIENT_CLINIC_OR_DEPARTMENT_OTHER): Admitting: Internal Medicine

## 2024-06-16 DIAGNOSIS — Z79899 Other long term (current) drug therapy: Secondary | ICD-10-CM

## 2024-06-16 NOTE — Telephone Encounter (Signed)
 Pt c/o medication issue:  1. Name of Medication:   torsemide  (DEMADEX ) 20 MG tablet    2. How are you currently taking this medication (dosage and times per day)? As prescribed   3. Are you having a reaction (difficulty breathing--STAT)? No   4. What is your medication issue? Patient went to trauma center yesterday due to severe swelling and blistering on legs. Physician there advised he contact his cardiologist to have torsemide  increased. Please advise.

## 2024-06-17 MED ORDER — TORSEMIDE 20 MG PO TABS: ORAL_TABLET | ORAL | 2 refills | Status: AC

## 2024-06-17 NOTE — Telephone Encounter (Signed)
 Patient will increase torsemide  dose and get lab work at [facility].  He was advised to speak with the wound care center as he will need help to do dressing changes in his home

## 2024-06-17 NOTE — Telephone Encounter (Signed)
 Per note from PCP:   Shawn P Lazoff, DO - 06/15/2024 5:29 PM EST Formatting of this note might be different from the original. Patient has a history of congestive heart failure where he currently sees cardiology Dr. Maude Emmer with [facility] who orders patient's torsemide . I would have the wound care center notify his cardiologist of his current condition to see if they want to increase his torsemide  or to see the patient. However the patient is have any shortness of breath, chest pain, or low oxygen, would recommend to go to the emergency room for further evaluation. Electronically signed by Shawn P Lazoff, DO at 06/15/2024 5:31 PM EST  Back to top of Progress Notes Eva LABOR Swink - 06/15/2024 1:41 PM EST Formatting of this note might be different from the original. Copied from CRM #[id number]. Topic: Medication/Rx - Medication Request - Symptoms >> Jun 15, 2024 1:41 PM Eva RAMAN wrote: [place] [facility] [place] wound care is calling for clinical concerns (Ask: If symptom currently happening or happened earlier today? Must Review Emergent List for symptoms. Document Name of Triage Nurse/BH Rep taking the call when applicable) and for medication request (Obtain: Medication Name, Dosage, Quantity, Frequency, Quantity Requested (example: 30-day supply.)     Please advise.

## 2024-06-21 ENCOUNTER — Encounter (HOSPITAL_BASED_OUTPATIENT_CLINIC_OR_DEPARTMENT_OTHER): Admitting: Internal Medicine

## 2024-06-23 ENCOUNTER — Encounter (HOSPITAL_BASED_OUTPATIENT_CLINIC_OR_DEPARTMENT_OTHER): Admitting: Internal Medicine

## 2024-06-24 ENCOUNTER — Encounter (HOSPITAL_BASED_OUTPATIENT_CLINIC_OR_DEPARTMENT_OTHER): Admitting: Internal Medicine

## 2024-06-28 ENCOUNTER — Encounter (HOSPITAL_BASED_OUTPATIENT_CLINIC_OR_DEPARTMENT_OTHER): Admitting: Internal Medicine

## 2024-06-30 ENCOUNTER — Encounter (HOSPITAL_BASED_OUTPATIENT_CLINIC_OR_DEPARTMENT_OTHER): Admitting: Internal Medicine

## 2024-07-04 ENCOUNTER — Encounter (HOSPITAL_BASED_OUTPATIENT_CLINIC_OR_DEPARTMENT_OTHER): Admitting: Internal Medicine

## 2024-07-06 ENCOUNTER — Encounter (HOSPITAL_BASED_OUTPATIENT_CLINIC_OR_DEPARTMENT_OTHER): Admitting: Internal Medicine

## 2024-07-08 ENCOUNTER — Encounter (HOSPITAL_BASED_OUTPATIENT_CLINIC_OR_DEPARTMENT_OTHER): Admitting: Internal Medicine

## 2024-07-14 ENCOUNTER — Encounter (HOSPITAL_BASED_OUTPATIENT_CLINIC_OR_DEPARTMENT_OTHER): Admitting: Internal Medicine

## 2024-07-22 ENCOUNTER — Ambulatory Visit: Admitting: Cardiovascular Disease

## 2024-08-26 ENCOUNTER — Ambulatory Visit: Admitting: Urology
